# Patient Record
Sex: Female | Born: 1970
Health system: Southern US, Community
[De-identification: ages and names within clinical notes are randomized; demographics above are authoritative.]

---

## 1996-02-26 HISTORY — PX: KNEE ARTHROSCOPY: SUR90

## 1998-05-09 ENCOUNTER — Other Ambulatory Visit: Admission: RE | Admit: 1998-05-09 | Discharge: 1998-05-09 | Payer: Self-pay | Admitting: Gynecology

## 1999-05-15 ENCOUNTER — Other Ambulatory Visit: Admission: RE | Admit: 1999-05-15 | Discharge: 1999-05-15 | Payer: Self-pay | Admitting: Gynecology

## 2000-05-27 ENCOUNTER — Other Ambulatory Visit: Admission: RE | Admit: 2000-05-27 | Discharge: 2000-05-27 | Payer: Self-pay | Admitting: Gynecology

## 2001-06-02 ENCOUNTER — Other Ambulatory Visit: Admission: RE | Admit: 2001-06-02 | Discharge: 2001-06-02 | Payer: Self-pay | Admitting: Gynecology

## 2002-06-08 ENCOUNTER — Other Ambulatory Visit: Admission: RE | Admit: 2002-06-08 | Discharge: 2002-06-08 | Payer: Self-pay | Admitting: Gynecology

## 2003-09-23 ENCOUNTER — Other Ambulatory Visit: Admission: RE | Admit: 2003-09-23 | Discharge: 2003-09-23 | Payer: Self-pay | Admitting: Gynecology

## 2004-08-02 ENCOUNTER — Other Ambulatory Visit: Admission: RE | Admit: 2004-08-02 | Discharge: 2004-08-02 | Payer: Self-pay | Admitting: Obstetrics and Gynecology

## 2004-08-03 ENCOUNTER — Other Ambulatory Visit: Admission: RE | Admit: 2004-08-03 | Discharge: 2004-08-03 | Payer: Self-pay | Admitting: Obstetrics and Gynecology

## 2005-02-25 ENCOUNTER — Inpatient Hospital Stay (HOSPITAL_COMMUNITY): Admission: AD | Admit: 2005-02-25 | Discharge: 2005-02-27 | Payer: Self-pay | Admitting: Obstetrics & Gynecology

## 2007-11-18 ENCOUNTER — Ambulatory Visit (HOSPITAL_COMMUNITY): Admission: RE | Admit: 2007-11-18 | Discharge: 2007-11-18 | Payer: Self-pay | Admitting: Obstetrics and Gynecology

## 2008-02-26 HISTORY — PX: ABDOMINAL HYSTERECTOMY: SHX81

## 2008-05-25 ENCOUNTER — Ambulatory Visit (HOSPITAL_COMMUNITY): Admission: RE | Admit: 2008-05-25 | Discharge: 2008-05-25 | Payer: Self-pay | Admitting: Obstetrics and Gynecology

## 2008-08-18 ENCOUNTER — Ambulatory Visit (HOSPITAL_COMMUNITY): Admission: RE | Admit: 2008-08-18 | Discharge: 2008-08-18 | Payer: Self-pay | Admitting: Obstetrics and Gynecology

## 2008-12-16 ENCOUNTER — Encounter (INDEPENDENT_AMBULATORY_CARE_PROVIDER_SITE_OTHER): Payer: Self-pay | Admitting: Obstetrics and Gynecology

## 2008-12-16 ENCOUNTER — Inpatient Hospital Stay (HOSPITAL_COMMUNITY): Admission: RE | Admit: 2008-12-16 | Discharge: 2008-12-20 | Payer: Self-pay | Admitting: Obstetrics and Gynecology

## 2008-12-26 ENCOUNTER — Ambulatory Visit (HOSPITAL_COMMUNITY): Admission: RE | Admit: 2008-12-26 | Discharge: 2008-12-26 | Payer: Self-pay | Admitting: Obstetrics and Gynecology

## 2009-07-20 ENCOUNTER — Encounter: Admission: RE | Admit: 2009-07-20 | Discharge: 2009-08-19 | Payer: Self-pay | Admitting: Obstetrics and Gynecology

## 2010-05-31 LAB — URINALYSIS, ROUTINE W REFLEX MICROSCOPIC
Glucose, UA: NEGATIVE mg/dL
pH: 7 (ref 5.0–8.0)

## 2010-05-31 LAB — CROSSMATCH: ABO/RH(D): O POS

## 2010-05-31 LAB — COMPREHENSIVE METABOLIC PANEL
ALT: 18 U/L (ref 0–35)
ALT: 34 U/L (ref 0–35)
AST: 48 U/L — ABNORMAL HIGH (ref 0–37)
AST: 56 U/L — ABNORMAL HIGH (ref 0–37)
BUN: 12 mg/dL (ref 6–23)
CO2: 23 mEq/L (ref 19–32)
CO2: 24 mEq/L (ref 19–32)
CO2: 26 mEq/L (ref 19–32)
CO2: 28 mEq/L (ref 19–32)
Calcium: 8.2 mg/dL — ABNORMAL LOW (ref 8.4–10.5)
Chloride: 103 mEq/L (ref 96–112)
Chloride: 105 mEq/L (ref 96–112)
Chloride: 106 mEq/L (ref 96–112)
Chloride: 106 mEq/L (ref 96–112)
Creatinine, Ser: 0.59 mg/dL (ref 0.4–1.2)
Creatinine, Ser: 0.94 mg/dL (ref 0.4–1.2)
Creatinine, Ser: 1.23 mg/dL — ABNORMAL HIGH (ref 0.4–1.2)
GFR calc Af Amer: >60 mL/min (ref >60)
GFR calc Af Amer: >60 mL/min (ref >60)
GFR calc non Af Amer: 49 mL/min — ABNORMAL LOW (ref >60)
GFR calc non Af Amer: >60 mL/min (ref >60)
GFR calc non Af Amer: >60 mL/min (ref >60)
GFR calc non Af Amer: >60 mL/min (ref >60)
Glucose, Bld: 100 mg/dL — ABNORMAL HIGH (ref 70–99)
Glucose, Bld: 84 mg/dL (ref 70–99)
Glucose, Bld: 91 mg/dL (ref 70–99)
Potassium: 3.5 mEq/L (ref 3.5–5.1)
Sodium: 138 mEq/L (ref 135–145)
Total Bilirubin: 0.5 mg/dL (ref 0.3–1.2)
Total Bilirubin: 0.5 mg/dL (ref 0.3–1.2)
Total Bilirubin: 0.6 mg/dL (ref 0.3–1.2)
Total Bilirubin: 0.7 mg/dL (ref 0.3–1.2)

## 2010-05-31 LAB — CBC
HCT: 28.9 % — ABNORMAL LOW (ref 36.0–46.0)
HCT: 37.1 % (ref 36.0–46.0)
Hemoglobin: 12.7 g/dL (ref 12.0–15.0)
Hemoglobin: 8.8 g/dL — ABNORMAL LOW (ref 12.0–15.0)
Hemoglobin: 9 g/dL — ABNORMAL LOW (ref 12.0–15.0)
Hemoglobin: 9.8 g/dL — ABNORMAL LOW (ref 12.0–15.0)
MCHC: 34.2 g/dL (ref 30.0–36.0)
MCHC: 34.2 g/dL (ref 30.0–36.0)
MCV: 100.4 fL — ABNORMAL HIGH (ref 78.0–100.0)
MCV: 94.4 fL (ref 78.0–100.0)
MCV: 94.9 fL (ref 78.0–100.0)
MCV: 96 fL (ref 78.0–100.0)
MCV: 97.1 fL (ref 78.0–100.0)
Platelets: 78 10*3/uL — ABNORMAL LOW (ref 150–400)
Platelets: 82 10*3/uL — ABNORMAL LOW (ref 150–400)
RBC: 2.37 MIL/uL — ABNORMAL LOW (ref 3.87–5.11)
RBC: 2.68 MIL/uL — ABNORMAL LOW (ref 3.87–5.11)
RBC: 2.75 MIL/uL — ABNORMAL LOW (ref 3.87–5.11)
RBC: 3.06 MIL/uL — ABNORMAL LOW (ref 3.87–5.11)
RBC: 3.63 MIL/uL — ABNORMAL LOW (ref 3.87–5.11)
RBC: 3.69 MIL/uL — ABNORMAL LOW (ref 3.87–5.11)
RDW: 13.2 % (ref 11.5–15.5)
RDW: 14.7 % (ref 11.5–15.5)
WBC: 11.3 10*3/uL — ABNORMAL HIGH (ref 4.0–10.5)
WBC: 14.5 10*3/uL — ABNORMAL HIGH (ref 4.0–10.5)
WBC: 15.1 10*3/uL — ABNORMAL HIGH (ref 4.0–10.5)
WBC: 8.8 10*3/uL (ref 4.0–10.5)

## 2010-05-31 LAB — URINE MICROSCOPIC-ADD ON

## 2010-05-31 LAB — PREPARE FRESH FROZEN PLASMA

## 2010-05-31 LAB — PLATELET COUNT: Platelets: 112 10*3/uL — ABNORMAL LOW (ref 150–400)

## 2010-05-31 LAB — PROTIME-INR
INR: 1.22 (ref 0.00–1.49)
Prothrombin Time: 15.3 seconds — ABNORMAL HIGH (ref 11.6–15.2)

## 2010-07-13 NOTE — Discharge Summary (Signed)
[name], [name]                  ACCOUNT NO.:  [id number]   MEDICAL RECORD NO.:  [id number]          PATIENT TYPE:  INP   LOCATION:  9105                          FACILITY:  [facility]   PHYSICIAN:  Randye Lobo, M.D.   DATE OF BIRTH:  17-Jun-1970   DATE OF ADMISSION:  02/25/2005  DATE OF DISCHARGE:  02/27/2005                                 DISCHARGE SUMMARY   FINAL DIAGNOSES:  1.  Intrauterine pregnancy at term.  2.  Spontaneous rupture of membranes.  3.  Breech presentation.   PROCEDURE:  Primary low flap transverse cesarean section. Surgeon:  Dr.  Miguel Aschoff. Complications:  None.   This 40 year old G1 P0 presents at 39+ weeks gestation with spontaneous  rupture of membranes in the morning. The patient was having mild  contractions and on exam was noted be 2-3 cm. She was also thought to have a  breech presentation. This was confirmed by ultrasound. The patient's  antepartum course otherwise up to this point had been uncomplicated. She did  get pregnant using in in vitro fertilization and had some borderline  thrombocytopenia that was followed throughout her pregnancy. Otherwise,  antepartum course had been uncomplicated. She had a negative group B strep  culture obtained in the office in 35 weeks. She was taken to the operating  room on February 25, 2005, by Dr. Miguel Aschoff where a primary low flap  transverse cesarean section was performed with the delivery of an 8-pound 12-  ounce female infant with Apgars of 9 and 9. Delivery went without  complications. The patient's postoperative course was benign without any  significant fevers. The patient did have a syncopal episode on her  postoperative day but by postoperative day #1 she was feeling much better,  no problems. Her hemoglobin came back as 9.9. She was stable. She was felt  ready for discharge on postoperative day #2. Was sent home on a regular  diet, told to decrease activities, told to continue her prenatal vitamins  and  iron supplement twice daily, was given Percocet one to two every 4 hours  as needed for pain, told she could use over-the-counter ibuprofen up to 600  mg every 6 hours as needed for pain, was to follow up in the office in 2  days for her staple removal.   LABORATORY DATA ON DISCHARGE:  The patient had a hemoglobin of 9.7; white  blood cell count of 14.2; platelets of 146,000.      Olivia Ellis, P.A.-C.      Randye Lobo, M.D.  Electronically Signed    MB/MEDQ  D:  03/14/2005  T:  03/14/2005  Job:  [id number]

## 2010-07-13 NOTE — Op Note (Signed)
Olivia Ellis, Olivia Ellis                  ACCOUNT NO.:  [id number]   MEDICAL RECORD NO.:  [id number]          PATIENT TYPE:  INP   LOCATION:  9105                          FACILITY:  [facility]   PHYSICIAN:  Miguel Aschoff, M.D.       DATE OF BIRTH:  1970/05/13   DATE OF PROCEDURE:  02/25/2005  DATE OF DISCHARGE:                                 OPERATIVE REPORT   PREOPERATIVE DIAGNOSES:  1.  Intrauterine pregnancy at term.  2.  Spontaneous rupture of membranes.  3.  Breech presentation.   POSTOPERATIVE DIAGNOSES:  1.  Intrauterine pregnancy at term.  2.  Spontaneous rupture of membranes.  3.  Breech presentation.  4.  Delivery of a viable female infant, Apgars 9 and 9.   OPERATION/PROCEDURE:  Primary low flap transverse cesarean section.   SURGEON:  Miguel Aschoff, M.D.   ANESTHESIA:  Spinal.   COMPLICATIONS:  None.   JUSTIFICATION:  The patient is a 40 year old white female, gravida 1, para  0, at 39+ weeks gestation, developed spontaneous rupture of membranes on the  morning of February 25, 2005.  She is having mild uterine contractions and on  examination was noted to be 2-3 cm but with a breech presentation confirmed  by ultrasound.  Due to the presentation, she is being taken to the operating  room to undergo primary cesarean section.  The risks and benefits of this  procedure were discussed with the patient.   DESCRIPTION OF PROCEDURE:  The patient was taken to the operating room,  placed in the sitting position and spinal anesthesia was administered  without difficulty.  She was then placed in the supine position, deviated to  the left and prepped and draped in the usual sterile fashion.  A Foley  catheter was inserted.  Once this was done, Pfannenstiel incision was made,  extended down through the subcutaneous tissue with bleeding points being  clamped and coagulated as they were encountered.  The fascia was then  identified and incised transversely and separated from the underlying  rectus  muscles.  Rectus muscles were divided in the midline.  The peritoneum was  then found and entered carefully avoiding underlying structures.  Peritoneal  incision was extended under direct visualization.  At this point, the  bladder flap was created and protected with a bladder blade.  An elliptical  transverse incision was made into the lower uterine segment.  The uterine  cavity was entered.  Clear fluid was obtained and at this point the patient  was delivered of a viable female infant, Apgars 9 at one minute and 9 at five  minutes from a breech left sacrum anterior position.  The baby was in frank  breech position.  Nose and mouth were suctioned.  The baby was handed to the  pediatric team in attendance.  Cord bloods were then obtained for  appropriate studies.  The placenta and remaining products of conception were  removed from the uterus. Then the angles of the uterine incision were  identified, ligated using figure-of-eight sutures of #1 Vicryl.  The uterus  was  closed in layers.  The first layer was a running interlocking suture of  #1 Vicryl followed by an imbricating suture of #1 Vicryl.  Bladder flap was  reapproximated using running continuous 2-0 Vicryl suture.  At this point,  inspection was made for hemostasis.  Hemostasis appeared to be excellent.  The abdomen was irrigated with warm saline.  Lap counts and instrument  counts were taken and found to be correct, and then the abdomen was closed.  Parietal peritoneum was closed using running continuous  0 Vicryl suture.  Rectus muscles were reapproximated using running continuous 0 Vicryl suture.  Fascia was closed using two sutures of 0 Vicryl, each starting at the  fascial angles and meeting in the midline. Subcutaneous tissue and skin were  closed using staples.  The estimated blood loss was approximately 600 mL.  The patient tolerated the procedure well and sent to the recovery room in  satisfactory condition.  The  baby was taken to the nursery in satisfactory  condition.      Miguel Aschoff, M.D.  Electronically Signed     AR/MEDQ  D:  02/25/2005  T:  02/25/2005  Job:  [id number]

## 2011-02-06 ENCOUNTER — Other Ambulatory Visit: Payer: Self-pay | Admitting: Obstetrics and Gynecology

## 2012-03-05 ENCOUNTER — Other Ambulatory Visit: Payer: Self-pay | Admitting: Dermatology

## 2012-03-19 ENCOUNTER — Other Ambulatory Visit: Payer: Self-pay | Admitting: Obstetrics and Gynecology

## 2012-03-19 DIAGNOSIS — Z1231 Encounter for screening mammogram for malignant neoplasm of breast: Secondary | ICD-10-CM

## 2012-04-29 ENCOUNTER — Ambulatory Visit
Admission: RE | Admit: 2012-04-29 | Discharge: 2012-04-29 | Disposition: A | Discharge status: Home / Self Care | Payer: BC Managed Care – PPO | Source: Ambulatory Visit | Attending: Obstetrics and Gynecology | Admitting: Obstetrics and Gynecology

## 2012-04-29 DIAGNOSIS — Z1231 Encounter for screening mammogram for malignant neoplasm of breast: Secondary | ICD-10-CM

## 2012-05-05 ENCOUNTER — Other Ambulatory Visit: Payer: Self-pay | Admitting: Obstetrics and Gynecology

## 2012-05-05 DIAGNOSIS — R928 Other abnormal and inconclusive findings on diagnostic imaging of breast: Secondary | ICD-10-CM

## 2012-05-14 ENCOUNTER — Ambulatory Visit
Admission: RE | Admit: 2012-05-14 | Discharge: 2012-05-14 | Disposition: A | Discharge status: Home / Self Care | Payer: BC Managed Care – PPO | Source: Ambulatory Visit | Attending: Obstetrics and Gynecology | Admitting: Obstetrics and Gynecology

## 2012-05-14 ENCOUNTER — Other Ambulatory Visit: Payer: Self-pay | Admitting: Obstetrics and Gynecology

## 2012-05-14 DIAGNOSIS — R928 Other abnormal and inconclusive findings on diagnostic imaging of breast: Secondary | ICD-10-CM

## 2012-05-27 ENCOUNTER — Other Ambulatory Visit: Payer: BC Managed Care – PPO

## 2012-05-28 ENCOUNTER — Ambulatory Visit
Admission: RE | Admit: 2012-05-28 | Discharge: 2012-05-28 | Disposition: A | Discharge status: Home / Self Care | Payer: BC Managed Care – PPO | Source: Ambulatory Visit | Attending: Obstetrics and Gynecology | Admitting: Obstetrics and Gynecology

## 2012-05-28 ENCOUNTER — Ambulatory Visit: Admission: RE | Admit: 2012-05-28 | Payer: BC Managed Care – PPO | Source: Ambulatory Visit

## 2012-05-28 ENCOUNTER — Other Ambulatory Visit: Payer: Self-pay | Admitting: Obstetrics and Gynecology

## 2012-05-28 DIAGNOSIS — R928 Other abnormal and inconclusive findings on diagnostic imaging of breast: Secondary | ICD-10-CM

## 2012-05-28 DIAGNOSIS — R921 Mammographic calcification found on diagnostic imaging of breast: Secondary | ICD-10-CM

## 2012-06-03 ENCOUNTER — Other Ambulatory Visit: Payer: BC Managed Care – PPO

## 2012-06-04 ENCOUNTER — Inpatient Hospital Stay: Admission: RE | Admit: 2012-06-04 | Payer: BC Managed Care – PPO | Source: Ambulatory Visit

## 2012-06-04 ENCOUNTER — Ambulatory Visit
Admission: RE | Admit: 2012-06-04 | Discharge: 2012-06-04 | Disposition: A | Discharge status: Home / Self Care | Payer: BC Managed Care – PPO | Source: Ambulatory Visit | Attending: Obstetrics and Gynecology | Admitting: Obstetrics and Gynecology

## 2012-06-04 DIAGNOSIS — R921 Mammographic calcification found on diagnostic imaging of breast: Secondary | ICD-10-CM

## 2012-06-04 HISTORY — PX: BREAST BIOPSY: SHX20

## 2012-06-09 ENCOUNTER — Telehealth: Payer: Self-pay | Admitting: *Deleted

## 2012-06-09 NOTE — Telephone Encounter (Signed)
Report is already in EPIC, and I have reviewed it.

## 2012-06-09 NOTE — Telephone Encounter (Signed)
Surgical pathology report done on 06/04/2012. Report in your office. Not scanned in to EPIC? sue

## 2013-03-10 ENCOUNTER — Ambulatory Visit: Payer: Self-pay | Admitting: Obstetrics and Gynecology

## 2013-03-18 ENCOUNTER — Ambulatory Visit (INDEPENDENT_AMBULATORY_CARE_PROVIDER_SITE_OTHER): Payer: BC Managed Care – PPO | Admitting: Obstetrics and Gynecology

## 2013-03-18 ENCOUNTER — Encounter: Payer: Self-pay | Admitting: Obstetrics and Gynecology

## 2013-03-18 VITALS — BP 105/62 | HR 62 | Resp 16 | Ht 67.5 in | Wt 120.0 lb

## 2013-03-18 DIAGNOSIS — Z01419 Encounter for gynecological examination (general) (routine) without abnormal findings: Secondary | ICD-10-CM

## 2013-03-18 NOTE — Progress Notes (Signed)
GYNECOLOGY VISIT  PCP: Ivery Qualeaniel Patterson, MD  Referring provider:   HPI: 43 y.o.   Married  Caucasian  female   G2P2 with No LMP recorded. Patient has had a hysterectomy.   Supracervical hyst.  here for  Annual Exam Had a left breast biopsy in April 2014 which was benign fibrocystic change with calcifications.  Having increased headaches.  Occur anytime and are frontal. Are not weekly.  Tylenol not always helpful if waits too long. Still functional with them.   Taking allergy shots.  Patient feels it is stress related.   Not cyclic.  Walks three days a week.  Some ovulation pain.  Works three days a week.  Having significant pain with bowel movements, prior to and during.  Dietary change has not made a difference so far.   GI - Dr. Ewing SchleinMagod.  Hgb:  PCP Urine:  PCP  GYNECOLOGIC HISTORY: No LMP recorded. Patient has had a hysterectomy., supracervical due to placenta accreta. Sexually active:  yes Partner preference: female Contraception:   Hysterectomy Menopausal hormone therapy: no DES exposure:   no Blood transfusions:   Yes    2010 Sexually transmitted diseases:    GYN Procedures:  Hyst   Mammogram: 05/2012 abnormal, needle Bx left breast (benign)                   Pap:   03/04/12 neg. Negative HR HPV History of abnormal pap smear:  no   OB History   Grav Para Term Preterm Abortions TAB SAB Ect Mult Living   2 2        2        LIFESTYLE: Exercise:  Walking 2-4 days a week             Tobacco: no Alcohol: no Drug use:  no  OTHER HEALTH MAINTENANCE: Tetanus/TDap: less than 10 years Gardisil: no Influenza:  yes Zostavax:  no  Bone density: never Colonoscopy: never  Cholesterol check: yes 2014  normal  Family History  Problem Relation Age of Onset  . Hypertension Mother   . Diabetes Mother   . Hyperlipidemia Mother   . Diabetes Father   . Psoriasis Father     There are no active problems to display for this patient.  History reviewed. No pertinent past  medical history.  Past Surgical History  Procedure Laterality Date  . Abdominal hysterectomy  2010  . Cesarean section  2007 2010    x2  . Knee arthroscopy Right 1998    ALLERGIES: Biaxin and Keflex  Current Outpatient Prescriptions  Medication Sig Dispense Refill  . Fexofenadine HCl (ALLEGRA PO) Take by mouth daily.       No current facility-administered medications for this visit.     ROS:  Pertinent items are noted in HPI.  SOCIAL HISTORY:    PHYSICAL EXAMINATION:    BP 105/62  Pulse 62  Resp 16  Ht 5' 7.5" (1.715 m)  Wt 120 lb (54.432 kg)  BMI 18.51 kg/m2   Wt Readings from Last 3 Encounters:  03/18/13 120 lb (54.432 kg)     Ht Readings from Last 3 Encounters:  03/18/13 5' 7.5" (1.715 m)    General appearance: alert, cooperative and appears stated age Head: Normocephalic, without obvious abnormality, atraumatic Neck: no adenopathy, supple, symmetrical, trachea midline and thyroid not enlarged, symmetric, no tenderness/mass/nodules Lungs: clear to auscultation bilaterally Breasts: Inspection negative, No nipple retraction or dimpling, No nipple discharge or bleeding, No axillary or supraclavicular adenopathy, Normal to  palpation without dominant masses Heart: regular rate and rhythm Abdomen: soft, non-tender; no masses,  no organomegaly Extremities: extremities normal, atraumatic, no cyanosis or edema Skin: Skin color, texture, turgor normal. No rashes or lesions Lymph nodes: Cervical, supraclavicular, and axillary nodes normal. No abnormal inguinal nodes palpated Neurologic: Grossly normal  Pelvic: External genitalia:  no lesions              Urethra:  normal appearing urethra with no masses, tenderness or lesions              Bartholins and Skenes: normal                 Vagina: normal appearing vagina with normal color and discharge, no lesions              Cervix: normal appearance              Pap and high risk HPV testing done: no.            Bimanual  Exam:  Uterus:  uterus is absent.                                      Adnexa: normal adnexa in size, nontender and no masses                                      Rectovaginal: Confirms                                      Anus:  normal sphincter tone, no lesions  ASSESSMENT  Normal gynecologic exam. Status post supracervical hysterectomy for placenta accreta.  Ovulatory pain. Status post left breast biopsy - benign fibrocystic change. Rectal pain.  Adhesive disease/  PLAN  Mammogram in April 2015.  Patient will call.  Pap smear and high risk HPV testing not indicated. Counseled on increasing fiber in diet, stress reduction with exercise and decreased responsibilities if possible. Monitor headaches. Advil for headache or ovulatory pain.  To GI if rectal pain persists or worsens.  Labs with PCP.    Return annually or prn   An After Visit Summary was printed and given to the patient.

## 2013-03-18 NOTE — Patient Instructions (Signed)

## 2013-05-06 ENCOUNTER — Ambulatory Visit
Admission: RE | Admit: 2013-05-06 | Discharge: 2013-05-06 | Disposition: A | Discharge status: Home / Self Care | Payer: BC Managed Care – PPO | Source: Ambulatory Visit | Attending: Obstetrics and Gynecology | Admitting: Obstetrics and Gynecology

## 2013-05-06 DIAGNOSIS — Z01419 Encounter for gynecological examination (general) (routine) without abnormal findings: Secondary | ICD-10-CM

## 2013-12-27 ENCOUNTER — Encounter: Payer: Self-pay | Admitting: Obstetrics and Gynecology

## 2014-03-23 ENCOUNTER — Other Ambulatory Visit: Payer: Self-pay | Admitting: Dermatology

## 2014-03-23 ENCOUNTER — Ambulatory Visit: Payer: BC Managed Care – PPO | Admitting: Obstetrics and Gynecology

## 2014-03-30 ENCOUNTER — Encounter: Payer: Self-pay | Admitting: Obstetrics and Gynecology

## 2014-03-30 ENCOUNTER — Ambulatory Visit (INDEPENDENT_AMBULATORY_CARE_PROVIDER_SITE_OTHER): Payer: BLUE CROSS/BLUE SHIELD | Admitting: Obstetrics and Gynecology

## 2014-03-30 VITALS — BP 100/62 | HR 60 | Resp 16 | Ht 66.5 in | Wt 125.8 lb

## 2014-03-30 DIAGNOSIS — Z01419 Encounter for gynecological examination (general) (routine) without abnormal findings: Secondary | ICD-10-CM

## 2014-03-30 DIAGNOSIS — Z0000 Encounter for general adult medical examination without abnormal findings: Secondary | ICD-10-CM

## 2014-03-30 LAB — POCT URINALYSIS DIPSTICK
Bilirubin, UA: NEGATIVE
Glucose, UA: NEGATIVE
Ketones, UA: NEGATIVE
LEUKOCYTES UA: NEGATIVE
Nitrite, UA: NEGATIVE
PH UA: 5
PROTEIN UA: NEGATIVE
UROBILINOGEN UA: NEGATIVE

## 2014-03-30 NOTE — Progress Notes (Signed)
Patient ID: Olivia Ellis, female   DOB: January 23, 1971, 44 y.o.   MRN: [id number] 44 y.o. G2P2 MarriedCaucasianF here for annual exam.   Has periodic random spotting vaginally.  Is status post supracervical hysterectomy for placenta accreta.  Daughter starting kindergarten this year.   Had work up for family history of psoriatic arthritis which was negative.   History of breast calcifications and benign breast biopsy.  Does 3D mammogram yearly.   Having headaches.  No photophobia or nausea. Woke up with a strong headache last month.  Tylenol helped treat pain.   Working part time.   PCP:  Ivery Quale, MD  No LMP recorded. Patient has had a hysterectomy.(supracervical)      Sexually active: Yes.   female partner The current method of family planning is status post hysterectomy.    Exercising: Yes.    cardio and weights. Smoker:  no  Health Maintenance: Pap:  03-04-12 wnl:neg HR HPV History of abnormal Pap:  no MMG:  05-06-13 extremely dense/nl:[facility] Colonoscopy:  never BMD:   never TDaP:  Up to date with PCP pt. thinks Screening Labs:  Hb today: PCP, Urine today: Trace RBC's   reports that she has never smoked. She does not have any smokeless tobacco history on file. She reports that she does not drink alcohol or use illicit drugs.  History reviewed. No pertinent past medical history.  Past Surgical History  Procedure Laterality Date  . Abdominal hysterectomy  2010  . Cesarean section  2007 2010    x2  . Knee arthroscopy Right 1998    Current Outpatient Prescriptions  Medication Sig Dispense Refill  . cetirizine (ZYRTEC) 10 MG tablet Take 10 mg by mouth daily.    . Pediatric Multivit-Minerals-C (FLINTSTONES GUMMIES COMPLETE PO) Take 1 tablet by mouth daily.     No current facility-administered medications for this visit.    Family History  Problem Relation Age of Onset  . Hypertension Mother   . Diabetes Mother   . Hyperlipidemia Mother   . Diabetes  Father   . Psoriasis Father     ROS:  Pertinent items are noted in HPI.  Otherwise, a comprehensive ROS was negative.  Exam:   BP 100/62 mmHg  Pulse 60  Resp 16  Ht 5' 6.5" (1.689 m)  Wt 125 lb 12.8 oz (57.063 kg)  BMI 20.00 kg/m2      Height: 5' 6.5" (168.9 cm)  Ht Readings from Last 3 Encounters:  03/30/14 5' 6.5" (1.689 m)  03/18/13 5' 7.5" (1.715 m)    General appearance: alert, cooperative and appears stated age Head: Normocephalic, without obvious abnormality, atraumatic Neck: no adenopathy, supple, symmetrical, trachea midline and thyroid normal to inspection and palpation Lungs: clear to auscultation bilaterally Breasts: normal appearance, no masses or tenderness, Inspection negative, No nipple retraction or dimpling, No nipple discharge or bleeding, No axillary or supraclavicular adenopathy Heart: regular rate and rhythm Abdomen: soft, non-tender; bowel sounds normal; no masses,  no organomegaly Extremities: extremities normal, atraumatic, no cyanosis or edema Skin: Skin color, texture, turgor normal. No rashes or lesions Lymph nodes: Cervical, supraclavicular, and axillary nodes normal. No abnormal inguinal nodes palpated Neurologic: Grossly normal   Pelvic: External genitalia:  no lesions              Urethra:  normal appearing urethra with no masses, tenderness or lesions              Bartholins and Skenes: normal  Vagina: normal appearing vagina with normal color and discharge, no lesions              Cervix: no lesions              Pap taken: No. Bimanual Exam:  Uterus:  uterus absent              Adnexa: no mass, fullness, tenderness               Rectovaginal: Confirms               Anus:  normal sphincter tone, no lesions  Chaperone was present for exam.  A:  Well Woman with normal exam History of benign breast calcifications and dense breast tissue.  Status post supracervical hysterectomy.  Ovaries remain.  Microscopic hematuria.   Asymptomatic.  No work up needed.  Headaches.  P:   Mammogram 3D yearly.  pap smear not indicated.  I recommend follow up with PCP if headaches persist.  return annually or prn

## 2014-03-30 NOTE — Patient Instructions (Signed)

## 2014-03-31 ENCOUNTER — Other Ambulatory Visit: Payer: Self-pay

## 2014-03-31 DIAGNOSIS — Z1231 Encounter for screening mammogram for malignant neoplasm of breast: Secondary | ICD-10-CM

## 2014-05-11 ENCOUNTER — Ambulatory Visit: Payer: Self-pay

## 2014-05-18 ENCOUNTER — Ambulatory Visit
Admission: RE | Admit: 2014-05-18 | Discharge: 2014-05-18 | Disposition: A | Discharge status: Home / Self Care | Payer: BLUE CROSS/BLUE SHIELD | Source: Ambulatory Visit

## 2014-05-18 DIAGNOSIS — Z1231 Encounter for screening mammogram for malignant neoplasm of breast: Secondary | ICD-10-CM

## 2014-06-01 ENCOUNTER — Other Ambulatory Visit: Payer: Self-pay | Admitting: Dermatology

## 2015-04-05 ENCOUNTER — Ambulatory Visit: Payer: BLUE CROSS/BLUE SHIELD | Admitting: Obstetrics and Gynecology

## 2015-04-19 ENCOUNTER — Ambulatory Visit: Payer: BLUE CROSS/BLUE SHIELD | Admitting: Obstetrics and Gynecology

## 2015-04-19 ENCOUNTER — Encounter: Payer: Self-pay | Admitting: Obstetrics and Gynecology

## 2015-04-20 ENCOUNTER — Telehealth: Payer: Self-pay | Admitting: Obstetrics and Gynecology

## 2015-04-20 NOTE — Telephone Encounter (Signed)
Thank you for the message.  I see the patient has rescheduled.

## 2015-04-20 NOTE — Telephone Encounter (Signed)
Patient canceled her appointment on 04/19/15. (SAME DAY [facility])  Message to [facility] about missed appointment.

## 2015-05-04 ENCOUNTER — Other Ambulatory Visit: Payer: Self-pay

## 2015-05-04 DIAGNOSIS — Z1231 Encounter for screening mammogram for malignant neoplasm of breast: Secondary | ICD-10-CM

## 2015-06-06 DIAGNOSIS — J3089 Other allergic rhinitis: Secondary | ICD-10-CM | POA: Diagnosis not present

## 2015-06-06 DIAGNOSIS — J301 Allergic rhinitis due to pollen: Secondary | ICD-10-CM | POA: Diagnosis not present

## 2015-06-06 DIAGNOSIS — J3081 Allergic rhinitis due to animal (cat) (dog) hair and dander: Secondary | ICD-10-CM | POA: Diagnosis not present

## 2015-06-14 DIAGNOSIS — J3081 Allergic rhinitis due to animal (cat) (dog) hair and dander: Secondary | ICD-10-CM | POA: Diagnosis not present

## 2015-06-14 DIAGNOSIS — J301 Allergic rhinitis due to pollen: Secondary | ICD-10-CM | POA: Diagnosis not present

## 2015-06-14 DIAGNOSIS — J3089 Other allergic rhinitis: Secondary | ICD-10-CM | POA: Diagnosis not present

## 2015-06-28 DIAGNOSIS — J301 Allergic rhinitis due to pollen: Secondary | ICD-10-CM | POA: Diagnosis not present

## 2015-06-28 DIAGNOSIS — J3089 Other allergic rhinitis: Secondary | ICD-10-CM | POA: Diagnosis not present

## 2015-06-28 DIAGNOSIS — J3081 Allergic rhinitis due to animal (cat) (dog) hair and dander: Secondary | ICD-10-CM | POA: Diagnosis not present

## 2015-06-29 ENCOUNTER — Ambulatory Visit
Admission: RE | Admit: 2015-06-29 | Discharge: 2015-06-29 | Disposition: A | Discharge status: Home / Self Care | Payer: BLUE CROSS/BLUE SHIELD | Source: Ambulatory Visit

## 2015-06-29 DIAGNOSIS — Z0000 Encounter for general adult medical examination without abnormal findings: Secondary | ICD-10-CM | POA: Diagnosis not present

## 2015-06-29 DIAGNOSIS — Z1231 Encounter for screening mammogram for malignant neoplasm of breast: Secondary | ICD-10-CM

## 2015-07-05 ENCOUNTER — Encounter: Payer: Self-pay | Admitting: Obstetrics and Gynecology

## 2015-07-05 ENCOUNTER — Ambulatory Visit (INDEPENDENT_AMBULATORY_CARE_PROVIDER_SITE_OTHER): Payer: BLUE CROSS/BLUE SHIELD | Admitting: Obstetrics and Gynecology

## 2015-07-05 VITALS — BP 104/64 | HR 70 | Resp 18 | Ht 67.0 in | Wt 123.6 lb

## 2015-07-05 DIAGNOSIS — Z01419 Encounter for gynecological examination (general) (routine) without abnormal findings: Secondary | ICD-10-CM

## 2015-07-05 DIAGNOSIS — Z1151 Encounter for screening for human papillomavirus (HPV): Secondary | ICD-10-CM | POA: Diagnosis not present

## 2015-07-05 NOTE — Patient Instructions (Signed)
Health Maintenance, Female Adopting a healthy lifestyle and getting preventive care can go a long way to promote health and wellness. Talk with your health care provider about what schedule of regular examinations is right for you. This is a good chance for you to check in with your provider about disease prevention and staying healthy. In between checkups, there are plenty of things you can do on your own. Experts have done a lot of research about which lifestyle changes and preventive measures are most likely to keep you healthy. Ask your health care provider for more information. WEIGHT AND DIET  Eat a healthy diet  Be sure to include plenty of vegetables, fruits, low-fat dairy products, and lean protein.  Do not eat a lot of foods high in solid fats, added sugars, or salt.  Get regular exercise. This is one of the most important things you can do for your health.  Most adults should exercise for at least 150 minutes each week. The exercise should increase your heart rate and make you sweat (moderate-intensity exercise).  Most adults should also do strengthening exercises at least twice a week. This is in addition to the moderate-intensity exercise.  Maintain a healthy weight  Body mass index (BMI) is a measurement that can be used to identify possible weight problems. It estimates body fat based on height and weight. Your health care provider can help determine your BMI and help you achieve or maintain a healthy weight.  For females 20 years of age and older:   A BMI below 18.5 is considered underweight.  A BMI of 18.5 to 24.9 is normal.  A BMI of 25 to 29.9 is considered overweight.  A BMI of 30 and above is considered obese.  Watch levels of cholesterol and blood lipids  You should start having your blood tested for lipids and cholesterol at 43 years of age, then have this test every 5 years.  You may need to have your cholesterol levels checked more often if:  Your lipid  or cholesterol levels are high.  You are older than 43 years of age.  You are at high risk for heart disease.  CANCER SCREENING   Lung Cancer  Lung cancer screening is recommended for adults 55-80 years old who are at high risk for lung cancer because of a history of smoking.  A yearly low-dose CT scan of the lungs is recommended for people who:  Currently smoke.  Have quit within the past 15 years.  Have at least a 30-pack-year history of smoking. A pack year is smoking an average of one pack of cigarettes a day for 1 year.  Yearly screening should continue until it has been 15 years since you quit.  Yearly screening should stop if you develop a health problem that would prevent you from having lung cancer treatment.  Breast Cancer  Practice breast self-awareness. This means understanding how your breasts normally appear and feel.  It also means doing regular breast self-exams. Let your health care provider know about any changes, no matter how small.  If you are in your 20s or 30s, you should have a clinical breast exam (CBE) by a health care provider every 1-3 years as part of a regular health exam.  If you are 40 or older, have a CBE every year. Also consider having a breast X-ray (mammogram) every year.  If you have a family history of breast cancer, talk to your health care provider about genetic screening.  If you   are at high risk for breast cancer, talk to your health care provider about having an MRI and a mammogram every year.  Breast cancer gene (BRCA) assessment is recommended for women who have family members with BRCA-related cancers. BRCA-related cancers include:  Breast.  Ovarian.  Tubal.  Peritoneal cancers.  Results of the assessment will determine the need for genetic counseling and BRCA1 and BRCA2 testing. Cervical Cancer Your health care provider may recommend that you be screened regularly for cancer of the pelvic organs (ovaries, uterus, and  vagina). This screening involves a pelvic examination, including checking for microscopic changes to the surface of your cervix (Pap test). You may be encouraged to have this screening done every 3 years, beginning at age 21.  For women ages 30-65, health care providers may recommend pelvic exams and Pap testing every 3 years, or they may recommend the Pap and pelvic exam, combined with testing for human papilloma virus (HPV), every 5 years. Some types of HPV increase your risk of cervical cancer. Testing for HPV may also be done on women of any age with unclear Pap test results.  Other health care providers may not recommend any screening for nonpregnant women who are considered low risk for pelvic cancer and who do not have symptoms. Ask your health care provider if a screening pelvic exam is right for you.  If you have had past treatment for cervical cancer or a condition that could lead to cancer, you need Pap tests and screening for cancer for at least 20 years after your treatment. If Pap tests have been discontinued, your risk factors (such as having a new sexual partner) need to be reassessed to determine if screening should resume. Some women have medical problems that increase the chance of getting cervical cancer. In these cases, your health care provider may recommend more frequent screening and Pap tests. Colorectal Cancer  This type of cancer can be detected and often prevented.  Routine colorectal cancer screening usually begins at 43 years of age and continues through 43 years of age.  Your health care provider may recommend screening at an earlier age if you have risk factors for colon cancer.  Your health care provider may also recommend using home test kits to check for hidden blood in the stool.  A small camera at the end of a tube can be used to examine your colon directly (sigmoidoscopy or colonoscopy). This is done to check for the earliest forms of colorectal  cancer.  Routine screening usually begins at age 50.  Direct examination of the colon should be repeated every 5-10 years through 43 years of age. However, you may need to be screened more often if early forms of precancerous polyps or small growths are found. Skin Cancer  Check your skin from head to toe regularly.  Tell your health care provider about any new moles or changes in moles, especially if there is a change in a mole's shape or color.  Also tell your health care provider if you have a mole that is larger than the size of a pencil eraser.  Always use sunscreen. Apply sunscreen liberally and repeatedly throughout the day.  Protect yourself by wearing long sleeves, pants, a wide-brimmed hat, and sunglasses whenever you are outside. HEART DISEASE, DIABETES, AND HIGH BLOOD PRESSURE   High blood pressure causes heart disease and increases the risk of stroke. High blood pressure is more likely to develop in:  People who have blood pressure in the high end   of the normal range (130-139/85-89 mm Hg).  People who are overweight or obese.  People who are African American.  If you are 38-23 years of age, have your blood pressure checked every 3-5 years. If you are 61 years of age or older, have your blood pressure checked every year. You should have your blood pressure measured twice--once when you are at a hospital or clinic, and once when you are not at a hospital or clinic. Record the average of the two measurements. To check your blood pressure when you are not at a hospital or clinic, you can use:  An automated blood pressure machine at a pharmacy.  A home blood pressure monitor.  If you are between 45 years and 39 years old, ask your health care provider if you should take aspirin to prevent strokes.  Have regular diabetes screenings. This involves taking a blood sample to check your fasting blood sugar level.  If you are at a normal weight and have a low risk for diabetes,  have this test once every three years after 43 years of age.  If you are overweight and have a high risk for diabetes, consider being tested at a younger age or more often. PREVENTING INFECTION  Hepatitis B  If you have a higher risk for hepatitis B, you should be screened for this virus. You are considered at high risk for hepatitis B if:  You were born in a country where hepatitis B is common. Ask your health care provider which countries are considered high risk.  Your parents were born in a high-risk country, and you have not been immunized against hepatitis B (hepatitis B vaccine).  You have HIV or AIDS.  You use needles to inject street drugs.  You live with someone who has hepatitis B.  You have had sex with someone who has hepatitis B.  You get hemodialysis treatment.  You take certain medicines for conditions, including cancer, organ transplantation, and autoimmune conditions. Hepatitis C  Blood testing is recommended for:  Everyone born from [birth year] through 1963.  Anyone with known risk factors for hepatitis C. Sexually transmitted infections (STIs)  You should be screened for sexually transmitted infections (STIs) including gonorrhea and chlamydia if:  You are sexually active and are younger than 43 years of age.  You are older than 43 years of age and your health care provider tells you that you are at risk for this type of infection.  Your sexual activity has changed since you were last screened and you are at an increased risk for chlamydia or gonorrhea. Ask your health care provider if you are at risk.  If you do not have HIV, but are at risk, it may be recommended that you take a prescription medicine daily to prevent HIV infection. This is called pre-exposure prophylaxis (PrEP). You are considered at risk if:  You are sexually active and do not regularly use condoms or know the HIV status of your partner(s).  You take drugs by injection.  You are sexually  active with a partner who has HIV. Talk with your health care provider about whether you are at high risk of being infected with HIV. If you choose to begin PrEP, you should first be tested for HIV. You should then be tested every 3 months for as long as you are taking PrEP.  PREGNANCY   If you are premenopausal and you may become pregnant, ask your health care provider about preconception counseling.  If you may  become pregnant, take 400 to 800 micrograms (mcg) of folic acid every day.  If you want to prevent pregnancy, talk to your health care provider about birth control (contraception). OSTEOPOROSIS AND MENOPAUSE   Osteoporosis is a disease in which the bones lose minerals and strength with aging. This can result in serious bone fractures. Your risk for osteoporosis can be identified using a bone density scan.  If you are 61 years of age or older, or if you are at risk for osteoporosis and fractures, ask your health care provider if you should be screened.  Ask your health care provider whether you should take a calcium or vitamin D supplement to lower your risk for osteoporosis.  Menopause may have certain physical symptoms and risks.  Hormone replacement therapy may reduce some of these symptoms and risks. Talk to your health care provider about whether hormone replacement therapy is right for you.  HOME CARE INSTRUCTIONS   Schedule regular health, dental, and eye exams.  Stay current with your immunizations.   Do not use any tobacco products including cigarettes, chewing tobacco, or electronic cigarettes.  If you are pregnant, do not drink alcohol.  If you are breastfeeding, limit how much and how often you drink alcohol.  Limit alcohol intake to no more than 1 drink per day for nonpregnant women. One drink equals 12 ounces of beer, 5 ounces of wine, or 1 ounces of hard liquor.  Do not use street drugs.  Do not share needles.  Ask your health care provider for help if  you need support or information about quitting drugs.  Tell your health care provider if you often feel depressed.  Tell your health care provider if you have ever been abused or do not feel safe at home.   This information is not intended to replace advice given to you by your health care provider. Make sure you discuss any questions you have with your health care provider.   Document Released: 08/27/2010 Document Revised: 03/04/2014 Document Reviewed: 01/13/2013 Elsevier Interactive Patient Education [employer].

## 2015-07-05 NOTE — Progress Notes (Signed)
Patient ID: Olivia MeekerLorie A Ellis, female   DOB: Oct 06, 1970, 45 y.o.   MRN: [id number] 45 y.o. G2P2 Married Caucasian female here for annual exam.    Ran a 5K last weekend.   Having random lower abdominal pain.  Lasts for a minute or two. Occurs with need to have bowel movement or urinate. This occurring since last delivery which was a Cesarean Section with supracervical hysterectomy.  BMs once a day or once every other day.   PCP:   Jarome Matinaniel Paterson, MD  No LMP recorded. Patient has had a hysterectomy.           Sexually active: Yes.   female The current method of family planning is status post hysterectomy--SUPRACERVICAL.    Exercising: Yes.    cardio and weight training. Smoker:  no  Health Maintenance: Pap:  03-04-12 Neg:Neg HR HPV(supracervical Hysterectomy) History of abnormal Pap:  no MMG:  07-02-15 Density D/Neg/[facility] Colonoscopy:  n/a BMD:   n/a  Result  n/a TDaP:  PCP Gardasil:   N/A HIV:  Negative in pregnancy.   Screening Labs:  Hb today: PCP, Urine today: unable to void   reports that she has never smoked. She does not have any smokeless tobacco history on file. She reports that she does not drink alcohol or use illicit drugs.  History reviewed. No pertinent past medical history.  Past Surgical History  Procedure Laterality Date  . Abdominal hysterectomy  2010  . Cesarean section  2007 2010    x2  . Knee arthroscopy Right 1998    Current Outpatient Prescriptions  Medication Sig Dispense Refill  . cetirizine (ZYRTEC) 10 MG tablet Take 10 mg by mouth daily.    . Cholecalciferol (VITAMIN D3 ADULT GUMMIES PO) Take 1 tablet by mouth daily. Takes 1 500mg  gummie daily    . Pediatric Multivit-Minerals-C (FLINTSTONES GUMMIES COMPLETE PO) Take 1 tablet by mouth daily.     No current facility-administered medications for this visit.    Family History  Problem Relation Age of Onset  . Hypertension Mother   . Diabetes Mother   . Hyperlipidemia Mother   .  Diabetes Father   . Psoriasis Father     ROS:  Pertinent items are noted in HPI.  Otherwise, a comprehensive ROS was negative.  Exam:   BP 104/64 mmHg  Pulse 70  Resp 18  Ht 5\' 7"  (1.702 m)  Wt 123 lb 9.6 oz (56.065 kg)  BMI 19.35 kg/m2    General appearance: alert, cooperative and appears stated age Head: Normocephalic, without obvious abnormality, atraumatic Neck: no adenopathy, supple, symmetrical, trachea midline and thyroid normal to inspection and palpation Lungs: clear to auscultation bilaterally Breasts: normal appearance, no masses or tenderness, Inspection negative, No nipple retraction or dimpling, No nipple discharge or bleeding, No axillary or supraclavicular adenopathy Heart: regular rate and rhythm Abdomen: incisions:  Yes.    Pfannenstiel , soft, non-tender; no masses, no organomegaly Extremities: extremities normal, atraumatic, no cyanosis or edema Skin: Skin color, texture, turgor normal. No rashes or lesions Lymph nodes: Cervical, supraclavicular, and axillary nodes normal. No abnormal inguinal nodes palpated Neurologic: Grossly normal  Pelvic: External genitalia:  no lesions              Urethra:  normal appearing urethra with no masses, tenderness or lesions              Bartholins and Skenes: normal  Vagina: normal appearing vagina with normal color and discharge, no lesions              Cervix: no lesions              Pap taken: Yes.   Bimanual Exam:  Uterus:  uterus absent              Adnexa: no mass, fullness, tenderness              Rectal exam: Yes.  .  Confirms.              Anus:  normal sphincter tone, no lesions  Chaperone was present for exam.  Assessment:   Well woman visit with normal exam. Lower abdominal pain.  Constipation?  Plan: Yearly mammogram recommended after age 74.  Recommended self breast exam.  Pap and HR HPV as above. Guidelines for Calcium, Vitamin D, regular exercise program including cardiovascular and  weight bearing exercise. Labs performed.  No..   Prescription medication(s) given.  No..   Will try daily Colace or Pericolace.  Pelvic ultrasound if pain persists. Follow up annually and prn.       After visit summary provided.

## 2015-07-06 DIAGNOSIS — J310 Chronic rhinitis: Secondary | ICD-10-CM | POA: Diagnosis not present

## 2015-07-06 DIAGNOSIS — Z1389 Encounter for screening for other disorder: Secondary | ICD-10-CM | POA: Diagnosis not present

## 2015-07-06 DIAGNOSIS — R1084 Generalized abdominal pain: Secondary | ICD-10-CM | POA: Diagnosis not present

## 2015-07-06 DIAGNOSIS — Z0000 Encounter for general adult medical examination without abnormal findings: Secondary | ICD-10-CM | POA: Diagnosis not present

## 2015-07-06 DIAGNOSIS — Z681 Body mass index (BMI) 19 or less, adult: Secondary | ICD-10-CM | POA: Diagnosis not present

## 2015-07-10 DIAGNOSIS — J301 Allergic rhinitis due to pollen: Secondary | ICD-10-CM | POA: Diagnosis not present

## 2015-07-10 DIAGNOSIS — J3081 Allergic rhinitis due to animal (cat) (dog) hair and dander: Secondary | ICD-10-CM | POA: Diagnosis not present

## 2015-07-10 DIAGNOSIS — J3089 Other allergic rhinitis: Secondary | ICD-10-CM | POA: Diagnosis not present

## 2015-07-10 LAB — IPS PAP TEST WITH HPV

## 2015-07-19 DIAGNOSIS — D2239 Melanocytic nevi of other parts of face: Secondary | ICD-10-CM | POA: Diagnosis not present

## 2015-07-19 DIAGNOSIS — D225 Melanocytic nevi of trunk: Secondary | ICD-10-CM | POA: Diagnosis not present

## 2015-07-19 DIAGNOSIS — D1801 Hemangioma of skin and subcutaneous tissue: Secondary | ICD-10-CM | POA: Diagnosis not present

## 2015-07-19 DIAGNOSIS — D2262 Melanocytic nevi of left upper limb, including shoulder: Secondary | ICD-10-CM | POA: Diagnosis not present

## 2015-07-25 DIAGNOSIS — J301 Allergic rhinitis due to pollen: Secondary | ICD-10-CM | POA: Diagnosis not present

## 2015-07-25 DIAGNOSIS — J3081 Allergic rhinitis due to animal (cat) (dog) hair and dander: Secondary | ICD-10-CM | POA: Diagnosis not present

## 2015-07-25 DIAGNOSIS — J3089 Other allergic rhinitis: Secondary | ICD-10-CM | POA: Diagnosis not present

## 2015-08-01 DIAGNOSIS — J301 Allergic rhinitis due to pollen: Secondary | ICD-10-CM | POA: Diagnosis not present

## 2015-08-01 DIAGNOSIS — J3081 Allergic rhinitis due to animal (cat) (dog) hair and dander: Secondary | ICD-10-CM | POA: Diagnosis not present

## 2015-08-01 DIAGNOSIS — J3089 Other allergic rhinitis: Secondary | ICD-10-CM | POA: Diagnosis not present

## 2015-08-07 DIAGNOSIS — J3089 Other allergic rhinitis: Secondary | ICD-10-CM | POA: Diagnosis not present

## 2015-08-07 DIAGNOSIS — J3081 Allergic rhinitis due to animal (cat) (dog) hair and dander: Secondary | ICD-10-CM | POA: Diagnosis not present

## 2015-08-07 DIAGNOSIS — J301 Allergic rhinitis due to pollen: Secondary | ICD-10-CM | POA: Diagnosis not present

## 2015-08-17 DIAGNOSIS — H04123 Dry eye syndrome of bilateral lacrimal glands: Secondary | ICD-10-CM | POA: Diagnosis not present

## 2015-08-17 DIAGNOSIS — J301 Allergic rhinitis due to pollen: Secondary | ICD-10-CM | POA: Diagnosis not present

## 2015-08-17 DIAGNOSIS — J3081 Allergic rhinitis due to animal (cat) (dog) hair and dander: Secondary | ICD-10-CM | POA: Diagnosis not present

## 2015-08-18 DIAGNOSIS — J3089 Other allergic rhinitis: Secondary | ICD-10-CM | POA: Diagnosis not present

## 2015-08-21 DIAGNOSIS — J3081 Allergic rhinitis due to animal (cat) (dog) hair and dander: Secondary | ICD-10-CM | POA: Diagnosis not present

## 2015-08-21 DIAGNOSIS — J3089 Other allergic rhinitis: Secondary | ICD-10-CM | POA: Diagnosis not present

## 2015-08-21 DIAGNOSIS — J301 Allergic rhinitis due to pollen: Secondary | ICD-10-CM | POA: Diagnosis not present

## 2015-09-05 DIAGNOSIS — J301 Allergic rhinitis due to pollen: Secondary | ICD-10-CM | POA: Diagnosis not present

## 2015-09-05 DIAGNOSIS — J3089 Other allergic rhinitis: Secondary | ICD-10-CM | POA: Diagnosis not present

## 2015-09-05 DIAGNOSIS — J3081 Allergic rhinitis due to animal (cat) (dog) hair and dander: Secondary | ICD-10-CM | POA: Diagnosis not present

## 2015-09-18 DIAGNOSIS — J3089 Other allergic rhinitis: Secondary | ICD-10-CM | POA: Diagnosis not present

## 2015-09-18 DIAGNOSIS — J3081 Allergic rhinitis due to animal (cat) (dog) hair and dander: Secondary | ICD-10-CM | POA: Diagnosis not present

## 2015-09-18 DIAGNOSIS — J301 Allergic rhinitis due to pollen: Secondary | ICD-10-CM | POA: Diagnosis not present

## 2015-10-02 DIAGNOSIS — J3089 Other allergic rhinitis: Secondary | ICD-10-CM | POA: Diagnosis not present

## 2015-10-02 DIAGNOSIS — J3081 Allergic rhinitis due to animal (cat) (dog) hair and dander: Secondary | ICD-10-CM | POA: Diagnosis not present

## 2015-10-02 DIAGNOSIS — J301 Allergic rhinitis due to pollen: Secondary | ICD-10-CM | POA: Diagnosis not present

## 2015-10-16 DIAGNOSIS — J3081 Allergic rhinitis due to animal (cat) (dog) hair and dander: Secondary | ICD-10-CM | POA: Diagnosis not present

## 2015-10-16 DIAGNOSIS — J301 Allergic rhinitis due to pollen: Secondary | ICD-10-CM | POA: Diagnosis not present

## 2015-10-16 DIAGNOSIS — J3089 Other allergic rhinitis: Secondary | ICD-10-CM | POA: Diagnosis not present

## 2015-10-31 DIAGNOSIS — J301 Allergic rhinitis due to pollen: Secondary | ICD-10-CM | POA: Diagnosis not present

## 2015-10-31 DIAGNOSIS — J3081 Allergic rhinitis due to animal (cat) (dog) hair and dander: Secondary | ICD-10-CM | POA: Diagnosis not present

## 2015-10-31 DIAGNOSIS — J3089 Other allergic rhinitis: Secondary | ICD-10-CM | POA: Diagnosis not present

## 2015-11-06 DIAGNOSIS — J301 Allergic rhinitis due to pollen: Secondary | ICD-10-CM | POA: Diagnosis not present

## 2015-11-06 DIAGNOSIS — J3081 Allergic rhinitis due to animal (cat) (dog) hair and dander: Secondary | ICD-10-CM | POA: Diagnosis not present

## 2015-11-06 DIAGNOSIS — J3089 Other allergic rhinitis: Secondary | ICD-10-CM | POA: Diagnosis not present

## 2015-11-13 DIAGNOSIS — J301 Allergic rhinitis due to pollen: Secondary | ICD-10-CM | POA: Diagnosis not present

## 2015-11-13 DIAGNOSIS — J3081 Allergic rhinitis due to animal (cat) (dog) hair and dander: Secondary | ICD-10-CM | POA: Diagnosis not present

## 2015-11-13 DIAGNOSIS — J3089 Other allergic rhinitis: Secondary | ICD-10-CM | POA: Diagnosis not present

## 2015-11-20 DIAGNOSIS — J3081 Allergic rhinitis due to animal (cat) (dog) hair and dander: Secondary | ICD-10-CM | POA: Diagnosis not present

## 2015-11-20 DIAGNOSIS — J301 Allergic rhinitis due to pollen: Secondary | ICD-10-CM | POA: Diagnosis not present

## 2015-11-20 DIAGNOSIS — J3089 Other allergic rhinitis: Secondary | ICD-10-CM | POA: Diagnosis not present

## 2015-12-05 DIAGNOSIS — J3089 Other allergic rhinitis: Secondary | ICD-10-CM | POA: Diagnosis not present

## 2015-12-05 DIAGNOSIS — J301 Allergic rhinitis due to pollen: Secondary | ICD-10-CM | POA: Diagnosis not present

## 2015-12-05 DIAGNOSIS — J3081 Allergic rhinitis due to animal (cat) (dog) hair and dander: Secondary | ICD-10-CM | POA: Diagnosis not present

## 2015-12-11 DIAGNOSIS — J301 Allergic rhinitis due to pollen: Secondary | ICD-10-CM | POA: Diagnosis not present

## 2015-12-11 DIAGNOSIS — J3081 Allergic rhinitis due to animal (cat) (dog) hair and dander: Secondary | ICD-10-CM | POA: Diagnosis not present

## 2015-12-11 DIAGNOSIS — J3089 Other allergic rhinitis: Secondary | ICD-10-CM | POA: Diagnosis not present

## 2015-12-20 DIAGNOSIS — L820 Inflamed seborrheic keratosis: Secondary | ICD-10-CM | POA: Diagnosis not present

## 2015-12-20 DIAGNOSIS — L718 Other rosacea: Secondary | ICD-10-CM | POA: Diagnosis not present

## 2015-12-20 DIAGNOSIS — L570 Actinic keratosis: Secondary | ICD-10-CM | POA: Diagnosis not present

## 2015-12-22 DIAGNOSIS — Z23 Encounter for immunization: Secondary | ICD-10-CM | POA: Diagnosis not present

## 2015-12-25 DIAGNOSIS — J3081 Allergic rhinitis due to animal (cat) (dog) hair and dander: Secondary | ICD-10-CM | POA: Diagnosis not present

## 2015-12-25 DIAGNOSIS — J301 Allergic rhinitis due to pollen: Secondary | ICD-10-CM | POA: Diagnosis not present

## 2015-12-25 DIAGNOSIS — J3089 Other allergic rhinitis: Secondary | ICD-10-CM | POA: Diagnosis not present

## 2016-01-01 DIAGNOSIS — J3081 Allergic rhinitis due to animal (cat) (dog) hair and dander: Secondary | ICD-10-CM | POA: Diagnosis not present

## 2016-01-01 DIAGNOSIS — J301 Allergic rhinitis due to pollen: Secondary | ICD-10-CM | POA: Diagnosis not present

## 2016-01-01 DIAGNOSIS — J3089 Other allergic rhinitis: Secondary | ICD-10-CM | POA: Diagnosis not present

## 2016-01-09 DIAGNOSIS — M7542 Impingement syndrome of left shoulder: Secondary | ICD-10-CM | POA: Diagnosis not present

## 2016-01-10 DIAGNOSIS — J301 Allergic rhinitis due to pollen: Secondary | ICD-10-CM | POA: Diagnosis not present

## 2016-01-10 DIAGNOSIS — J3089 Other allergic rhinitis: Secondary | ICD-10-CM | POA: Diagnosis not present

## 2016-01-10 DIAGNOSIS — J3081 Allergic rhinitis due to animal (cat) (dog) hair and dander: Secondary | ICD-10-CM | POA: Diagnosis not present

## 2016-01-12 DIAGNOSIS — M7542 Impingement syndrome of left shoulder: Secondary | ICD-10-CM | POA: Diagnosis not present

## 2016-01-15 DIAGNOSIS — M7532 Calcific tendinitis of left shoulder: Secondary | ICD-10-CM | POA: Diagnosis not present

## 2016-01-17 DIAGNOSIS — J301 Allergic rhinitis due to pollen: Secondary | ICD-10-CM | POA: Diagnosis not present

## 2016-01-17 DIAGNOSIS — J3089 Other allergic rhinitis: Secondary | ICD-10-CM | POA: Diagnosis not present

## 2016-01-17 DIAGNOSIS — J3081 Allergic rhinitis due to animal (cat) (dog) hair and dander: Secondary | ICD-10-CM | POA: Diagnosis not present

## 2016-01-23 DIAGNOSIS — J3081 Allergic rhinitis due to animal (cat) (dog) hair and dander: Secondary | ICD-10-CM | POA: Diagnosis not present

## 2016-01-23 DIAGNOSIS — M7542 Impingement syndrome of left shoulder: Secondary | ICD-10-CM | POA: Diagnosis not present

## 2016-01-23 DIAGNOSIS — J301 Allergic rhinitis due to pollen: Secondary | ICD-10-CM | POA: Diagnosis not present

## 2016-01-23 DIAGNOSIS — J3089 Other allergic rhinitis: Secondary | ICD-10-CM | POA: Diagnosis not present

## 2016-02-05 DIAGNOSIS — J301 Allergic rhinitis due to pollen: Secondary | ICD-10-CM | POA: Diagnosis not present

## 2016-02-05 DIAGNOSIS — J3081 Allergic rhinitis due to animal (cat) (dog) hair and dander: Secondary | ICD-10-CM | POA: Diagnosis not present

## 2016-02-05 DIAGNOSIS — J3089 Other allergic rhinitis: Secondary | ICD-10-CM | POA: Diagnosis not present

## 2016-02-06 DIAGNOSIS — M7542 Impingement syndrome of left shoulder: Secondary | ICD-10-CM | POA: Diagnosis not present

## 2016-02-22 DIAGNOSIS — J301 Allergic rhinitis due to pollen: Secondary | ICD-10-CM | POA: Diagnosis not present

## 2016-02-22 DIAGNOSIS — J3089 Other allergic rhinitis: Secondary | ICD-10-CM | POA: Diagnosis not present

## 2016-02-22 DIAGNOSIS — J3081 Allergic rhinitis due to animal (cat) (dog) hair and dander: Secondary | ICD-10-CM | POA: Diagnosis not present

## 2016-03-04 DIAGNOSIS — J3089 Other allergic rhinitis: Secondary | ICD-10-CM | POA: Diagnosis not present

## 2016-03-04 DIAGNOSIS — J3081 Allergic rhinitis due to animal (cat) (dog) hair and dander: Secondary | ICD-10-CM | POA: Diagnosis not present

## 2016-03-04 DIAGNOSIS — J301 Allergic rhinitis due to pollen: Secondary | ICD-10-CM | POA: Diagnosis not present

## 2016-03-26 DIAGNOSIS — J3089 Other allergic rhinitis: Secondary | ICD-10-CM | POA: Diagnosis not present

## 2016-03-26 DIAGNOSIS — J301 Allergic rhinitis due to pollen: Secondary | ICD-10-CM | POA: Diagnosis not present

## 2016-03-26 DIAGNOSIS — J3081 Allergic rhinitis due to animal (cat) (dog) hair and dander: Secondary | ICD-10-CM | POA: Diagnosis not present

## 2016-03-27 DIAGNOSIS — J3081 Allergic rhinitis due to animal (cat) (dog) hair and dander: Secondary | ICD-10-CM | POA: Diagnosis not present

## 2016-03-27 DIAGNOSIS — J3089 Other allergic rhinitis: Secondary | ICD-10-CM | POA: Diagnosis not present

## 2016-03-27 DIAGNOSIS — J301 Allergic rhinitis due to pollen: Secondary | ICD-10-CM | POA: Diagnosis not present

## 2016-03-27 DIAGNOSIS — J0101 Acute recurrent maxillary sinusitis: Secondary | ICD-10-CM | POA: Diagnosis not present

## 2016-04-23 DIAGNOSIS — J3081 Allergic rhinitis due to animal (cat) (dog) hair and dander: Secondary | ICD-10-CM | POA: Diagnosis not present

## 2016-04-23 DIAGNOSIS — J3089 Other allergic rhinitis: Secondary | ICD-10-CM | POA: Diagnosis not present

## 2016-04-23 DIAGNOSIS — J301 Allergic rhinitis due to pollen: Secondary | ICD-10-CM | POA: Diagnosis not present

## 2016-05-07 DIAGNOSIS — J3089 Other allergic rhinitis: Secondary | ICD-10-CM | POA: Diagnosis not present

## 2016-05-07 DIAGNOSIS — J301 Allergic rhinitis due to pollen: Secondary | ICD-10-CM | POA: Diagnosis not present

## 2016-05-07 DIAGNOSIS — J3081 Allergic rhinitis due to animal (cat) (dog) hair and dander: Secondary | ICD-10-CM | POA: Diagnosis not present

## 2016-05-21 DIAGNOSIS — J301 Allergic rhinitis due to pollen: Secondary | ICD-10-CM | POA: Diagnosis not present

## 2016-05-21 DIAGNOSIS — J3089 Other allergic rhinitis: Secondary | ICD-10-CM | POA: Diagnosis not present

## 2016-05-21 DIAGNOSIS — J3081 Allergic rhinitis due to animal (cat) (dog) hair and dander: Secondary | ICD-10-CM | POA: Diagnosis not present

## 2016-05-22 DIAGNOSIS — L570 Actinic keratosis: Secondary | ICD-10-CM | POA: Diagnosis not present

## 2016-05-22 DIAGNOSIS — D2271 Melanocytic nevi of right lower limb, including hip: Secondary | ICD-10-CM | POA: Diagnosis not present

## 2016-05-27 DIAGNOSIS — J301 Allergic rhinitis due to pollen: Secondary | ICD-10-CM | POA: Diagnosis not present

## 2016-05-28 DIAGNOSIS — J3089 Other allergic rhinitis: Secondary | ICD-10-CM | POA: Diagnosis not present

## 2016-06-06 ENCOUNTER — Other Ambulatory Visit: Payer: Self-pay | Admitting: Obstetrics and Gynecology

## 2016-06-06 DIAGNOSIS — Z1231 Encounter for screening mammogram for malignant neoplasm of breast: Secondary | ICD-10-CM

## 2016-06-11 DIAGNOSIS — J301 Allergic rhinitis due to pollen: Secondary | ICD-10-CM | POA: Diagnosis not present

## 2016-06-11 DIAGNOSIS — J3089 Other allergic rhinitis: Secondary | ICD-10-CM | POA: Diagnosis not present

## 2016-06-11 DIAGNOSIS — J3081 Allergic rhinitis due to animal (cat) (dog) hair and dander: Secondary | ICD-10-CM | POA: Diagnosis not present

## 2016-06-25 DIAGNOSIS — J3081 Allergic rhinitis due to animal (cat) (dog) hair and dander: Secondary | ICD-10-CM | POA: Diagnosis not present

## 2016-06-25 DIAGNOSIS — J3089 Other allergic rhinitis: Secondary | ICD-10-CM | POA: Diagnosis not present

## 2016-06-25 DIAGNOSIS — J301 Allergic rhinitis due to pollen: Secondary | ICD-10-CM | POA: Diagnosis not present

## 2016-07-09 DIAGNOSIS — J3089 Other allergic rhinitis: Secondary | ICD-10-CM | POA: Diagnosis not present

## 2016-07-09 DIAGNOSIS — J301 Allergic rhinitis due to pollen: Secondary | ICD-10-CM | POA: Diagnosis not present

## 2016-07-09 DIAGNOSIS — J3081 Allergic rhinitis due to animal (cat) (dog) hair and dander: Secondary | ICD-10-CM | POA: Diagnosis not present

## 2016-07-11 ENCOUNTER — Ambulatory Visit: Payer: BLUE CROSS/BLUE SHIELD

## 2016-07-11 DIAGNOSIS — R3129 Other microscopic hematuria: Secondary | ICD-10-CM | POA: Diagnosis not present

## 2016-07-11 DIAGNOSIS — Z0000 Encounter for general adult medical examination without abnormal findings: Secondary | ICD-10-CM | POA: Diagnosis not present

## 2016-07-17 ENCOUNTER — Ambulatory Visit (INDEPENDENT_AMBULATORY_CARE_PROVIDER_SITE_OTHER): Payer: BLUE CROSS/BLUE SHIELD | Admitting: Obstetrics and Gynecology

## 2016-07-17 ENCOUNTER — Encounter: Payer: Self-pay | Admitting: Obstetrics and Gynecology

## 2016-07-17 VITALS — BP 112/70 | HR 64 | Resp 16 | Ht 66.75 in | Wt 127.0 lb

## 2016-07-17 DIAGNOSIS — R1030 Lower abdominal pain, unspecified: Secondary | ICD-10-CM | POA: Diagnosis not present

## 2016-07-17 DIAGNOSIS — Z01419 Encounter for gynecological examination (general) (routine) without abnormal findings: Secondary | ICD-10-CM | POA: Diagnosis not present

## 2016-07-17 LAB — POCT URINALYSIS DIPSTICK
BILIRUBIN UA: NEGATIVE
Glucose, UA: NEGATIVE
KETONES UA: NEGATIVE
LEUKOCYTES UA: NEGATIVE
Nitrite, UA: NEGATIVE
Protein, UA: NEGATIVE
Urobilinogen, UA: 0.2 E.U./dL

## 2016-07-17 NOTE — Progress Notes (Signed)
46 y.o. G2P2 Married Caucasian female here for annual exam.    Feeling bloated recently.  No constipation or diarrhea.  Some urinary urgency.  Some LLQ pain but states both ovaries are painful. Pain with BM and urination.  Denies blood in the urine or stool. States she has had the pain since her C-Hyst.   No hot flashes.   Will see PCP soon.  She will do labs with them.   Urine dip - trace RBCs.  PCP: Dr. Jarome Matin - [facility]    No LMP recorded. Patient has had a hysterectomy.           Sexually active: Yes.    The current method of family planning is status post hysterectomy.    Exercising: Yes.    cardio, strength, walking Smoker:  no  Health Maintenance: Pap:  07/05/15 Pap and HR HPV negative  03-04-12 Neg:Neg HR HPV(supracervical Hysterectomy)  02/06/11 Pap smear Neg History of abnormal Pap:  no MMG:  06/29/15 BIRADS 1 negative/density D -- scheduled 07/31/16 at [facility] Colonoscopy:  n/a BMD:   n/a  Result  n/a TDaP: unsure will discuss w/ PCP Gardasil:   N/a HIV: negative with pregnancy Hep C: not done Screening Labs:  Hb today: PCP takes care of labs   reports that she has never smoked. She has never used smokeless tobacco. She reports that she does not drink alcohol or use drugs.  History reviewed. No pertinent past medical history.  Past Surgical History:  Procedure Laterality Date  . ABDOMINAL HYSTERECTOMY  2010  . CESAREAN SECTION  2007 2010   x2  . KNEE ARTHROSCOPY Right 1998    Current Outpatient Prescriptions  Medication Sig Dispense Refill  . cetirizine (ZYRTEC) 10 MG tablet Take 10 mg by mouth daily.     No current facility-administered medications for this visit.     Family History  Problem Relation Age of Onset  . Hypertension Mother   . Diabetes Mother   . Hyperlipidemia Mother   . Diabetes Father   . Psoriasis Father     ROS:  Pertinent items are noted in HPI.  Otherwise, a comprehensive ROS was negative.  Exam:    BP 112/70 (BP Location: Right Arm, Patient Position: Sitting, Cuff Size: Normal)   Pulse 64   Resp 16   Ht 5' 6.75" (1.695 m)   Wt 127 lb (57.6 kg)   BMI 20.04 kg/m     General appearance: alert, cooperative and appears stated age Head: Normocephalic, without obvious abnormality, atraumatic Neck: no adenopathy, supple, symmetrical, trachea midline and thyroid normal to inspection and palpation Lungs: clear to auscultation bilaterally Breasts: normal appearance, no masses or tenderness, No nipple retraction or dimpling, No nipple discharge or bleeding, No axillary or supraclavicular adenopathy Heart: regular rate and rhythm Abdomen: Pfannenstiel incision, soft, non-tender; no masses, no organomegaly Extremities: extremities normal, atraumatic, no cyanosis or edema Skin: Skin color, texture, turgor normal. No rashes or lesions Lymph nodes: Cervical, supraclavicular, and axillary nodes normal. No abnormal inguinal nodes palpated Neurologic: Grossly normal  Pelvic: External genitalia:  no lesions              Urethra:  normal appearing urethra with no masses, tenderness or lesions              Bartholins and Skenes: normal                 Vagina: normal appearing vagina with normal color and discharge, no lesions  Cervix: no lesions              Pap taken: No. Bimanual Exam:  Uterus:  Absent.              Adnexa: no mass, fullness, tenderness              Rectal exam: Yes.  .  Confirms.              Anus:  normal sphincter tone, no lesions  Chaperone was present for exam.  Assessment:   Well woman visit with normal exam. Status post C-Hyst with supracervical hysterectomy.  Chronic lower abdominal pain. Microscopic hematuria.   Plan: Mammogram screening discussed. Recommended self breast awareness. Pap and HR HPV as above. Guidelines for Calcium, Vitamin D, regular exercise program including cardiovascular and weight bearing exercise. Discussed potential causes of  lower abdominal pain - endometriosis, scar tissue, bowel origin.  Return for pelvic ultrasound.  Will consider possible course of low dose combined oral contraceptives.  Discussed also possible GI consultation.  Urine micro and culture.   Follow up annually and prn.   After visit summary provided.

## 2016-07-17 NOTE — Patient Instructions (Signed)

## 2016-07-17 NOTE — Addendum Note (Signed)
Addended by: Michell Heinrich'NEAL, SUMMER D on: 07/17/2016 10:08 AM   Modules accepted: Orders

## 2016-07-18 ENCOUNTER — Telehealth: Payer: Self-pay | Admitting: Obstetrics and Gynecology

## 2016-07-18 DIAGNOSIS — Z1389 Encounter for screening for other disorder: Secondary | ICD-10-CM | POA: Diagnosis not present

## 2016-07-18 DIAGNOSIS — M545 Low back pain: Secondary | ICD-10-CM | POA: Diagnosis not present

## 2016-07-18 DIAGNOSIS — E784 Other hyperlipidemia: Secondary | ICD-10-CM | POA: Diagnosis not present

## 2016-07-18 DIAGNOSIS — R51 Headache: Secondary | ICD-10-CM | POA: Diagnosis not present

## 2016-07-18 DIAGNOSIS — Z0000 Encounter for general adult medical examination without abnormal findings: Secondary | ICD-10-CM | POA: Diagnosis not present

## 2016-07-18 LAB — URINALYSIS, MICROSCOPIC ONLY
BACTERIA UA: NONE SEEN [HPF]
Casts: NONE SEEN [LPF]
Crystals: NONE SEEN [HPF]
RBC / HPF: NONE SEEN RBC/HPF (ref <=2)
WBC UA: NONE SEEN WBC/HPF (ref <=5)
Yeast: NONE SEEN [HPF]

## 2016-07-18 LAB — URINE CULTURE: ORGANISM ID, BACTERIA: NO GROWTH

## 2016-07-18 NOTE — Telephone Encounter (Signed)
Patient is returning a call to Suzy. °

## 2016-07-18 NOTE — Telephone Encounter (Signed)
Returned call to patient. Reviewed benefit for an ultrasound. Patient understood and agreeable. Patient ready to schedule. Patient scheduled 08/01/16 with Dr Edward JollySilva. Patient aware of date, arrival time and cancellation policy. No further questions.   Routing to Dr Edward JollySilva

## 2016-07-18 NOTE — Telephone Encounter (Signed)
Thank you for the update.  Encounter closed. 

## 2016-07-23 ENCOUNTER — Telehealth: Payer: Self-pay

## 2016-07-23 NOTE — Telephone Encounter (Signed)
Left message to call Lord Lancour at [phone]. 

## 2016-07-23 NOTE — Telephone Encounter (Signed)
-----   Message from Olivia SallesBrook E Amundson C Silva, MD sent at 07/19/2016  5:04 PM EDT ----- Please inform patient that her final urine micro and culture are negative.  No further evaluation if urine needed.

## 2016-07-23 NOTE — Telephone Encounter (Signed)
Spoke with patient. Advised of results as seen below from Dr.Silva. Patient is agreeable and verbalizes understanding.  Routing to provider for final review. Patient agreeable to disposition. Will close encounter.     

## 2016-07-25 DIAGNOSIS — J301 Allergic rhinitis due to pollen: Secondary | ICD-10-CM | POA: Diagnosis not present

## 2016-07-25 DIAGNOSIS — J3089 Other allergic rhinitis: Secondary | ICD-10-CM | POA: Diagnosis not present

## 2016-07-25 DIAGNOSIS — J3081 Allergic rhinitis due to animal (cat) (dog) hair and dander: Secondary | ICD-10-CM | POA: Diagnosis not present

## 2016-07-31 ENCOUNTER — Ambulatory Visit
Admission: RE | Admit: 2016-07-31 | Discharge: 2016-07-31 | Disposition: A | Discharge status: Home / Self Care | Payer: BLUE CROSS/BLUE SHIELD | Source: Ambulatory Visit | Attending: Obstetrics and Gynecology | Admitting: Obstetrics and Gynecology

## 2016-07-31 DIAGNOSIS — J3081 Allergic rhinitis due to animal (cat) (dog) hair and dander: Secondary | ICD-10-CM | POA: Diagnosis not present

## 2016-07-31 DIAGNOSIS — Z1231 Encounter for screening mammogram for malignant neoplasm of breast: Secondary | ICD-10-CM

## 2016-07-31 DIAGNOSIS — J3089 Other allergic rhinitis: Secondary | ICD-10-CM | POA: Diagnosis not present

## 2016-07-31 DIAGNOSIS — J301 Allergic rhinitis due to pollen: Secondary | ICD-10-CM | POA: Diagnosis not present

## 2016-08-01 ENCOUNTER — Ambulatory Visit (INDEPENDENT_AMBULATORY_CARE_PROVIDER_SITE_OTHER): Payer: BLUE CROSS/BLUE SHIELD

## 2016-08-01 ENCOUNTER — Encounter: Payer: Self-pay | Admitting: Obstetrics and Gynecology

## 2016-08-01 ENCOUNTER — Ambulatory Visit (INDEPENDENT_AMBULATORY_CARE_PROVIDER_SITE_OTHER): Payer: BLUE CROSS/BLUE SHIELD | Admitting: Obstetrics and Gynecology

## 2016-08-01 VITALS — BP 98/60 | HR 68 | Resp 16 | Wt 127.0 lb

## 2016-08-01 DIAGNOSIS — R1032 Left lower quadrant pain: Secondary | ICD-10-CM | POA: Diagnosis not present

## 2016-08-01 DIAGNOSIS — R1030 Lower abdominal pain, unspecified: Secondary | ICD-10-CM

## 2016-08-01 NOTE — Progress Notes (Signed)
GYNECOLOGY  VISIT   HPI: 46 y.o.   Married  Caucasian  female   G2P2 with No LMP recorded. Patient has had a hysterectomy.   here for pelvic ultrasound for LLQ pain since her Cesarean supracervical hysterectomy. Has dyspareunia also.   Pain in LLQ occurs randomly and lasts for a couple of minutes when it occurs. Can occur "just walking into [employer]." Rarely lasts longer.  Sees Dr. Ewing SchleinMagod for her GI care. Reflux.  Had an upper GI and CT scan years ago.  Sometimes Bms are not really regular, but no concern or problems with this.   Does position changes and uses lubricants for sex.  GYNECOLOGIC HISTORY: No LMP recorded. Patient has had a hysterectomy. Contraception: Hysterectomy Menopausal hormone therapy:  n/a Last mammogram:  07/31/16 BIRADS 1 negative/density d Last pap smear:   07/05/15 Pap and HR HPV negative        OB History    Gravida Para Term Preterm AB Living   2 2       2    SAB TAB Ectopic Multiple Live Births                     There are no active problems to display for this patient.   History reviewed. No pertinent past medical history.  Past Surgical History:  Procedure Laterality Date  . ABDOMINAL HYSTERECTOMY  2010  . BREAST BIOPSY Left 06/04/2012  . CESAREAN SECTION  2007 2010   x2  . KNEE ARTHROSCOPY Right 1998    Current Outpatient Prescriptions  Medication Sig Dispense Refill  . cetirizine (ZYRTEC) 10 MG tablet Take 10 mg by mouth daily.     No current facility-administered medications for this visit.      ALLERGIES: Biaxin [clarithromycin] and Keflex [cephalexin]  Family History  Problem Relation Age of Onset  . Hypertension Mother   . Diabetes Mother   . Hyperlipidemia Mother   . Diabetes Father   . Psoriasis Father     Social History   Social History  . Marital status: Married    Spouse name: N/A  . Number of children: N/A  . Years of education: N/A   Occupational History  . Not on file.   Social History Main Topics  .  Smoking status: Never Smoker  . Smokeless tobacco: Never Used  . Alcohol use No  . Drug use: No  . Sexual activity: Yes    Partners: Male    Birth control/ protection: Surgical     Comment: Hyst--supracervical   Other Topics Concern  . Not on file   Social History Narrative  . No narrative on file    ROS:  Pertinent items are noted in HPI.  PHYSICAL EXAMINATION:    BP 98/60 (BP Location: Right Arm, Patient Position: Sitting, Cuff Size: Normal)   Pulse 68   Resp 16   Wt 127 lb (57.6 kg)   BMI 20.04 kg/m     General appearance: alert, cooperative and appears stated age  Pelvic ultrasound: Absent uterus.  Normal cervix.  Ovaries normal.  Bilateral follicles.  No adnexal masses. No free fluid.    ASSESSMENT  LLQ pain.  Dyspareunia.  I suspect she may have adhesive disease. Status post Cesarean supracervical hysterectomy.    PLAN  Discussed possible tx for pain - Flexeril or Levsin, but I don't think that this will act fast enough.  We reviewed laparoscopy for evaluation and treatment of pain.  We discussed signs and symptoms  of small bowel obstruction as the ultimate sign she needs intervention.  For now, she just wants to observe and she feels better knowing that her reproductive anatomy is normal. Follow up prn.   An After Visit Summary was printed and given to the patient.  ___15___ minutes face to face time of which over 50% was spent in counseling.

## 2016-08-01 NOTE — Progress Notes (Signed)
Encounter reviewed by Dr. Brook Amundson C. Silva.  

## 2016-08-01 NOTE — Patient Instructions (Signed)
Call if your pain worsens!

## 2016-08-06 DIAGNOSIS — J3081 Allergic rhinitis due to animal (cat) (dog) hair and dander: Secondary | ICD-10-CM | POA: Diagnosis not present

## 2016-08-06 DIAGNOSIS — J301 Allergic rhinitis due to pollen: Secondary | ICD-10-CM | POA: Diagnosis not present

## 2016-08-06 DIAGNOSIS — J3089 Other allergic rhinitis: Secondary | ICD-10-CM | POA: Diagnosis not present

## 2016-08-20 DIAGNOSIS — J3081 Allergic rhinitis due to animal (cat) (dog) hair and dander: Secondary | ICD-10-CM | POA: Diagnosis not present

## 2016-08-20 DIAGNOSIS — J3089 Other allergic rhinitis: Secondary | ICD-10-CM | POA: Diagnosis not present

## 2016-08-20 DIAGNOSIS — J301 Allergic rhinitis due to pollen: Secondary | ICD-10-CM | POA: Diagnosis not present

## 2016-09-10 DIAGNOSIS — J3081 Allergic rhinitis due to animal (cat) (dog) hair and dander: Secondary | ICD-10-CM | POA: Diagnosis not present

## 2016-09-10 DIAGNOSIS — J3089 Other allergic rhinitis: Secondary | ICD-10-CM | POA: Diagnosis not present

## 2016-09-10 DIAGNOSIS — J301 Allergic rhinitis due to pollen: Secondary | ICD-10-CM | POA: Diagnosis not present

## 2016-09-24 DIAGNOSIS — J3089 Other allergic rhinitis: Secondary | ICD-10-CM | POA: Diagnosis not present

## 2016-09-24 DIAGNOSIS — J3081 Allergic rhinitis due to animal (cat) (dog) hair and dander: Secondary | ICD-10-CM | POA: Diagnosis not present

## 2016-09-24 DIAGNOSIS — J301 Allergic rhinitis due to pollen: Secondary | ICD-10-CM | POA: Diagnosis not present

## 2016-09-26 DIAGNOSIS — H0011 Chalazion right upper eyelid: Secondary | ICD-10-CM | POA: Diagnosis not present

## 2016-10-07 DIAGNOSIS — J301 Allergic rhinitis due to pollen: Secondary | ICD-10-CM | POA: Diagnosis not present

## 2016-10-07 DIAGNOSIS — J3089 Other allergic rhinitis: Secondary | ICD-10-CM | POA: Diagnosis not present

## 2016-10-07 DIAGNOSIS — J3081 Allergic rhinitis due to animal (cat) (dog) hair and dander: Secondary | ICD-10-CM | POA: Diagnosis not present

## 2016-10-24 DIAGNOSIS — J3089 Other allergic rhinitis: Secondary | ICD-10-CM | POA: Diagnosis not present

## 2016-10-24 DIAGNOSIS — J301 Allergic rhinitis due to pollen: Secondary | ICD-10-CM | POA: Diagnosis not present

## 2016-10-24 DIAGNOSIS — J3081 Allergic rhinitis due to animal (cat) (dog) hair and dander: Secondary | ICD-10-CM | POA: Diagnosis not present

## 2016-11-11 DIAGNOSIS — J3081 Allergic rhinitis due to animal (cat) (dog) hair and dander: Secondary | ICD-10-CM | POA: Diagnosis not present

## 2016-11-11 DIAGNOSIS — J301 Allergic rhinitis due to pollen: Secondary | ICD-10-CM | POA: Diagnosis not present

## 2016-11-11 DIAGNOSIS — J3089 Other allergic rhinitis: Secondary | ICD-10-CM | POA: Diagnosis not present

## 2016-11-15 DIAGNOSIS — J3081 Allergic rhinitis due to animal (cat) (dog) hair and dander: Secondary | ICD-10-CM | POA: Diagnosis not present

## 2016-11-15 DIAGNOSIS — J301 Allergic rhinitis due to pollen: Secondary | ICD-10-CM | POA: Diagnosis not present

## 2016-11-15 DIAGNOSIS — J3089 Other allergic rhinitis: Secondary | ICD-10-CM | POA: Diagnosis not present

## 2016-11-19 DIAGNOSIS — J301 Allergic rhinitis due to pollen: Secondary | ICD-10-CM | POA: Diagnosis not present

## 2016-11-19 DIAGNOSIS — J3081 Allergic rhinitis due to animal (cat) (dog) hair and dander: Secondary | ICD-10-CM | POA: Diagnosis not present

## 2016-11-19 DIAGNOSIS — J3089 Other allergic rhinitis: Secondary | ICD-10-CM | POA: Diagnosis not present

## 2016-11-26 DIAGNOSIS — J301 Allergic rhinitis due to pollen: Secondary | ICD-10-CM | POA: Diagnosis not present

## 2016-11-26 DIAGNOSIS — J3081 Allergic rhinitis due to animal (cat) (dog) hair and dander: Secondary | ICD-10-CM | POA: Diagnosis not present

## 2016-11-26 DIAGNOSIS — J3089 Other allergic rhinitis: Secondary | ICD-10-CM | POA: Diagnosis not present

## 2016-11-28 DIAGNOSIS — H00011 Hordeolum externum right upper eyelid: Secondary | ICD-10-CM | POA: Diagnosis not present

## 2016-11-28 DIAGNOSIS — J301 Allergic rhinitis due to pollen: Secondary | ICD-10-CM | POA: Diagnosis not present

## 2016-11-28 DIAGNOSIS — J3089 Other allergic rhinitis: Secondary | ICD-10-CM | POA: Diagnosis not present

## 2016-11-28 DIAGNOSIS — J3081 Allergic rhinitis due to animal (cat) (dog) hair and dander: Secondary | ICD-10-CM | POA: Diagnosis not present

## 2016-12-02 DIAGNOSIS — J301 Allergic rhinitis due to pollen: Secondary | ICD-10-CM | POA: Diagnosis not present

## 2016-12-02 DIAGNOSIS — J3081 Allergic rhinitis due to animal (cat) (dog) hair and dander: Secondary | ICD-10-CM | POA: Diagnosis not present

## 2016-12-02 DIAGNOSIS — J3089 Other allergic rhinitis: Secondary | ICD-10-CM | POA: Diagnosis not present

## 2016-12-04 DIAGNOSIS — D2239 Melanocytic nevi of other parts of face: Secondary | ICD-10-CM | POA: Diagnosis not present

## 2016-12-04 DIAGNOSIS — D225 Melanocytic nevi of trunk: Secondary | ICD-10-CM | POA: Diagnosis not present

## 2016-12-04 DIAGNOSIS — D1801 Hemangioma of skin and subcutaneous tissue: Secondary | ICD-10-CM | POA: Diagnosis not present

## 2016-12-04 DIAGNOSIS — L819 Disorder of pigmentation, unspecified: Secondary | ICD-10-CM | POA: Diagnosis not present

## 2016-12-17 DIAGNOSIS — J3089 Other allergic rhinitis: Secondary | ICD-10-CM | POA: Diagnosis not present

## 2016-12-17 DIAGNOSIS — J301 Allergic rhinitis due to pollen: Secondary | ICD-10-CM | POA: Diagnosis not present

## 2016-12-24 DIAGNOSIS — J3089 Other allergic rhinitis: Secondary | ICD-10-CM | POA: Diagnosis not present

## 2016-12-24 DIAGNOSIS — J3081 Allergic rhinitis due to animal (cat) (dog) hair and dander: Secondary | ICD-10-CM | POA: Diagnosis not present

## 2016-12-24 DIAGNOSIS — J301 Allergic rhinitis due to pollen: Secondary | ICD-10-CM | POA: Diagnosis not present

## 2016-12-27 DIAGNOSIS — Z23 Encounter for immunization: Secondary | ICD-10-CM | POA: Diagnosis not present

## 2017-01-06 DIAGNOSIS — J3089 Other allergic rhinitis: Secondary | ICD-10-CM | POA: Diagnosis not present

## 2017-01-06 DIAGNOSIS — J301 Allergic rhinitis due to pollen: Secondary | ICD-10-CM | POA: Diagnosis not present

## 2017-01-06 DIAGNOSIS — J3081 Allergic rhinitis due to animal (cat) (dog) hair and dander: Secondary | ICD-10-CM | POA: Diagnosis not present

## 2017-01-15 DIAGNOSIS — J3089 Other allergic rhinitis: Secondary | ICD-10-CM | POA: Diagnosis not present

## 2017-01-15 DIAGNOSIS — J3081 Allergic rhinitis due to animal (cat) (dog) hair and dander: Secondary | ICD-10-CM | POA: Diagnosis not present

## 2017-01-15 DIAGNOSIS — J301 Allergic rhinitis due to pollen: Secondary | ICD-10-CM | POA: Diagnosis not present

## 2017-01-28 DIAGNOSIS — J301 Allergic rhinitis due to pollen: Secondary | ICD-10-CM | POA: Diagnosis not present

## 2017-01-28 DIAGNOSIS — J3089 Other allergic rhinitis: Secondary | ICD-10-CM | POA: Diagnosis not present

## 2017-01-28 DIAGNOSIS — J3081 Allergic rhinitis due to animal (cat) (dog) hair and dander: Secondary | ICD-10-CM | POA: Diagnosis not present

## 2017-02-11 DIAGNOSIS — J3089 Other allergic rhinitis: Secondary | ICD-10-CM | POA: Diagnosis not present

## 2017-02-11 DIAGNOSIS — J301 Allergic rhinitis due to pollen: Secondary | ICD-10-CM | POA: Diagnosis not present

## 2017-02-11 DIAGNOSIS — J3081 Allergic rhinitis due to animal (cat) (dog) hair and dander: Secondary | ICD-10-CM | POA: Diagnosis not present

## 2017-03-04 DIAGNOSIS — J301 Allergic rhinitis due to pollen: Secondary | ICD-10-CM | POA: Diagnosis not present

## 2017-03-04 DIAGNOSIS — J3081 Allergic rhinitis due to animal (cat) (dog) hair and dander: Secondary | ICD-10-CM | POA: Diagnosis not present

## 2017-03-04 DIAGNOSIS — J3089 Other allergic rhinitis: Secondary | ICD-10-CM | POA: Diagnosis not present

## 2017-03-20 DIAGNOSIS — J301 Allergic rhinitis due to pollen: Secondary | ICD-10-CM | POA: Diagnosis not present

## 2017-03-20 DIAGNOSIS — J3081 Allergic rhinitis due to animal (cat) (dog) hair and dander: Secondary | ICD-10-CM | POA: Diagnosis not present

## 2017-03-20 DIAGNOSIS — J3089 Other allergic rhinitis: Secondary | ICD-10-CM | POA: Diagnosis not present

## 2017-04-08 DIAGNOSIS — J301 Allergic rhinitis due to pollen: Secondary | ICD-10-CM | POA: Diagnosis not present

## 2017-04-08 DIAGNOSIS — J3081 Allergic rhinitis due to animal (cat) (dog) hair and dander: Secondary | ICD-10-CM | POA: Diagnosis not present

## 2017-04-08 DIAGNOSIS — J3089 Other allergic rhinitis: Secondary | ICD-10-CM | POA: Diagnosis not present

## 2017-04-22 DIAGNOSIS — J301 Allergic rhinitis due to pollen: Secondary | ICD-10-CM | POA: Diagnosis not present

## 2017-04-22 DIAGNOSIS — J3089 Other allergic rhinitis: Secondary | ICD-10-CM | POA: Diagnosis not present

## 2017-04-22 DIAGNOSIS — J3081 Allergic rhinitis due to animal (cat) (dog) hair and dander: Secondary | ICD-10-CM | POA: Diagnosis not present

## 2017-05-20 DIAGNOSIS — J3081 Allergic rhinitis due to animal (cat) (dog) hair and dander: Secondary | ICD-10-CM | POA: Diagnosis not present

## 2017-05-20 DIAGNOSIS — J301 Allergic rhinitis due to pollen: Secondary | ICD-10-CM | POA: Diagnosis not present

## 2017-05-20 DIAGNOSIS — J3089 Other allergic rhinitis: Secondary | ICD-10-CM | POA: Diagnosis not present

## 2017-06-11 DIAGNOSIS — L570 Actinic keratosis: Secondary | ICD-10-CM | POA: Diagnosis not present

## 2017-07-02 ENCOUNTER — Other Ambulatory Visit: Payer: Self-pay | Admitting: Obstetrics and Gynecology

## 2017-07-02 DIAGNOSIS — Z1231 Encounter for screening mammogram for malignant neoplasm of breast: Secondary | ICD-10-CM

## 2017-07-23 ENCOUNTER — Ambulatory Visit: Payer: BLUE CROSS/BLUE SHIELD | Admitting: Obstetrics and Gynecology

## 2017-07-24 DIAGNOSIS — Z0000 Encounter for general adult medical examination without abnormal findings: Secondary | ICD-10-CM | POA: Diagnosis not present

## 2017-07-31 DIAGNOSIS — M79641 Pain in right hand: Secondary | ICD-10-CM | POA: Diagnosis not present

## 2017-07-31 DIAGNOSIS — M79642 Pain in left hand: Secondary | ICD-10-CM | POA: Diagnosis not present

## 2017-07-31 DIAGNOSIS — Z1389 Encounter for screening for other disorder: Secondary | ICD-10-CM | POA: Diagnosis not present

## 2017-07-31 DIAGNOSIS — M545 Low back pain: Secondary | ICD-10-CM | POA: Diagnosis not present

## 2017-07-31 DIAGNOSIS — Z0000 Encounter for general adult medical examination without abnormal findings: Secondary | ICD-10-CM | POA: Diagnosis not present

## 2017-07-31 DIAGNOSIS — E7849 Other hyperlipidemia: Secondary | ICD-10-CM | POA: Diagnosis not present

## 2017-08-01 DIAGNOSIS — Z1212 Encounter for screening for malignant neoplasm of rectum: Secondary | ICD-10-CM | POA: Diagnosis not present

## 2017-08-15 ENCOUNTER — Other Ambulatory Visit: Payer: Self-pay

## 2017-08-15 ENCOUNTER — Encounter: Payer: Self-pay | Admitting: Obstetrics and Gynecology

## 2017-08-15 ENCOUNTER — Ambulatory Visit: Payer: BLUE CROSS/BLUE SHIELD | Admitting: Obstetrics and Gynecology

## 2017-08-15 ENCOUNTER — Ambulatory Visit
Admission: RE | Admit: 2017-08-15 | Discharge: 2017-08-15 | Disposition: A | Discharge status: Home / Self Care | Payer: BLUE CROSS/BLUE SHIELD | Source: Ambulatory Visit | Attending: Obstetrics and Gynecology | Admitting: Obstetrics and Gynecology

## 2017-08-15 VITALS — BP 110/62 | HR 68 | Resp 16 | Ht 66.75 in | Wt 126.0 lb

## 2017-08-15 DIAGNOSIS — Z1231 Encounter for screening mammogram for malignant neoplasm of breast: Secondary | ICD-10-CM | POA: Diagnosis not present

## 2017-08-15 DIAGNOSIS — Z01419 Encounter for gynecological examination (general) (routine) without abnormal findings: Secondary | ICD-10-CM | POA: Diagnosis not present

## 2017-08-15 NOTE — Progress Notes (Signed)
47 y.o. G2P2 Married Caucasian female here for annual exam.    No more bleeding or spotting.  Increased heat at night, lasts for a short period of time.   Now not really having her lower abdominal pain any more.   Saw PCP last week and had normal visit and labs. Did an IFOB with PCP.  Had an autoimmune panel done as well.   PCP:   Ivery Quale.   No LMP recorded. Patient has had a hysterectomy.           Sexually active: Yes.    The current method of family planning is post hysterectomy--SUPRACERVICAL.    Exercising: Yes.    HIIT, walking, cycling Smoker:  no  Health Maintenance: Pap:  03-04-12 Neg:Neg HR HPV, 07-05-15 neg HPV HR neg (supracervical Hysterectomy) History of abnormal Pap:  no MMG:  07/31/16 BIRADS 1 negative/density d, today Colonoscopy:  n/a BMD:   n/a  Result  n/a TDaP:  PCP Gardasil:  no HIV: done with pcp Hep C: done with pcp Screening Labs:    reports that she has never smoked. She has never used smokeless tobacco. She reports that she does not drink alcohol or use drugs.  History reviewed. No pertinent past medical history.  Past Surgical History:  Procedure Laterality Date  . ABDOMINAL HYSTERECTOMY  2010  . BREAST BIOPSY Left 06/04/2012  . CESAREAN SECTION  2007 2010   x2  . KNEE ARTHROSCOPY Right 1998    Current Outpatient Medications  Medication Sig Dispense Refill  . cetirizine (ZYRTEC) 10 MG tablet Take 10 mg by mouth daily.    Marland Kitchen UNABLE TO FIND Allergy injections q o wk     No current facility-administered medications for this visit.     Family History  Problem Relation Age of Onset  . Hypertension Mother   . Diabetes Mother   . Hyperlipidemia Mother   . Diabetes Father   . Psoriasis Father     Review of Systems  Constitutional: Negative.   HENT: Negative.   Eyes: Negative.   Respiratory: Negative.   Cardiovascular: Negative.   Gastrointestinal: Negative.   Endocrine: Negative.   Genitourinary: Negative.    Musculoskeletal: Negative.   Skin: Negative.   Allergic/Immunologic: Negative.   Neurological: Negative.   Psychiatric/Behavioral: Negative.     Exam:   BP 110/62   Pulse 68   Resp 16   Ht 5' 6.75" (1.695 m)   Wt 126 lb (57.2 kg)   BMI 19.88 kg/m     General appearance: alert, cooperative and appears stated age Head: Normocephalic, without obvious abnormality, atraumatic Neck: no adenopathy, supple, symmetrical, trachea midline and thyroid normal to inspection and palpation Lungs: clear to auscultation bilaterally Breasts: normal appearance, no masses or tenderness, No nipple retraction or dimpling, No nipple discharge or bleeding, No axillary or supraclavicular adenopathy Heart: regular rate and rhythm Abdomen: soft, non-tender; no masses, no organomegaly Extremities: extremities normal, atraumatic, no cyanosis or edema Skin: Skin color, texture, turgor normal. No rashes or lesions Lymph nodes: Cervical, supraclavicular, and axillary nodes normal. No abnormal inguinal nodes palpated Neurologic: Grossly normal  Pelvic: External genitalia:  no lesions              Urethra:  normal appearing urethra with no masses, tenderness or lesions              Bartholins and Skenes: normal                 Vagina: normal  appearing vagina with normal color and discharge, no lesions              Cervix: no lesions              Pap taken: No. Bimanual Exam:  Uterus:   absent              Adnexa: no mass, fullness, tenderness              Rectal exam: Yes.  .  Confirms.              Anus:  normal sphincter tone, no lesions  Chaperone was present for exam.  Assessment:   Well woman visit with normal exam. Perimenopausal? Status post C-Hyst with supracervical hysterectomy.  Chronic lower abdominal pain. Improved.   Hx microscopic hematuria.   Plan: Mammogram screening. Recommended self breast awareness. Pap and HR HPV as above. Guidelines for Calcium, Vitamin D, regular exercise  program including cardiovascular and weight bearing exercise. Discussed Gardasil vaccine.  She declines.  We talked about important health issues in menopause - vaginal atrophy, increased risk of cardiovascular disease, and osteopenia/osteoporosis.  Follow up annually and prn.    After visit summary provided.

## 2017-08-15 NOTE — Patient Instructions (Signed)

## 2017-09-03 DIAGNOSIS — J301 Allergic rhinitis due to pollen: Secondary | ICD-10-CM | POA: Diagnosis not present

## 2017-09-03 DIAGNOSIS — J3081 Allergic rhinitis due to animal (cat) (dog) hair and dander: Secondary | ICD-10-CM | POA: Diagnosis not present

## 2017-09-04 DIAGNOSIS — J3089 Other allergic rhinitis: Secondary | ICD-10-CM | POA: Diagnosis not present

## 2017-12-09 DIAGNOSIS — L570 Actinic keratosis: Secondary | ICD-10-CM | POA: Diagnosis not present

## 2018-01-16 DIAGNOSIS — J3089 Other allergic rhinitis: Secondary | ICD-10-CM | POA: Diagnosis not present

## 2018-01-16 DIAGNOSIS — J3081 Allergic rhinitis due to animal (cat) (dog) hair and dander: Secondary | ICD-10-CM | POA: Diagnosis not present

## 2018-01-16 DIAGNOSIS — J301 Allergic rhinitis due to pollen: Secondary | ICD-10-CM | POA: Diagnosis not present

## 2018-02-24 DIAGNOSIS — H524 Presbyopia: Secondary | ICD-10-CM | POA: Diagnosis not present

## 2018-02-24 DIAGNOSIS — H04123 Dry eye syndrome of bilateral lacrimal glands: Secondary | ICD-10-CM | POA: Diagnosis not present

## 2018-02-25 DIAGNOSIS — U071 COVID-19: Secondary | ICD-10-CM

## 2018-02-25 HISTORY — DX: COVID-19: U07.1

## 2018-05-15 DIAGNOSIS — J301 Allergic rhinitis due to pollen: Secondary | ICD-10-CM | POA: Diagnosis not present

## 2018-05-15 DIAGNOSIS — J3081 Allergic rhinitis due to animal (cat) (dog) hair and dander: Secondary | ICD-10-CM | POA: Diagnosis not present

## 2018-05-15 DIAGNOSIS — J3089 Other allergic rhinitis: Secondary | ICD-10-CM | POA: Diagnosis not present

## 2018-07-28 DIAGNOSIS — Z0000 Encounter for general adult medical examination without abnormal findings: Secondary | ICD-10-CM | POA: Diagnosis not present

## 2018-07-28 DIAGNOSIS — E7849 Other hyperlipidemia: Secondary | ICD-10-CM | POA: Diagnosis not present

## 2018-08-04 DIAGNOSIS — J310 Chronic rhinitis: Secondary | ICD-10-CM | POA: Diagnosis not present

## 2018-08-04 DIAGNOSIS — Z0000 Encounter for general adult medical examination without abnormal findings: Secondary | ICD-10-CM | POA: Diagnosis not present

## 2018-08-04 DIAGNOSIS — E785 Hyperlipidemia, unspecified: Secondary | ICD-10-CM | POA: Diagnosis not present

## 2018-08-04 DIAGNOSIS — Z1331 Encounter for screening for depression: Secondary | ICD-10-CM | POA: Diagnosis not present

## 2018-08-04 DIAGNOSIS — M545 Low back pain: Secondary | ICD-10-CM | POA: Diagnosis not present

## 2018-08-04 DIAGNOSIS — M79641 Pain in right hand: Secondary | ICD-10-CM | POA: Diagnosis not present

## 2018-09-01 DIAGNOSIS — L570 Actinic keratosis: Secondary | ICD-10-CM | POA: Diagnosis not present

## 2018-09-01 DIAGNOSIS — D2262 Melanocytic nevi of left upper limb, including shoulder: Secondary | ICD-10-CM | POA: Diagnosis not present

## 2018-09-04 ENCOUNTER — Other Ambulatory Visit: Payer: Self-pay | Admitting: Obstetrics and Gynecology

## 2018-09-04 DIAGNOSIS — Z1231 Encounter for screening mammogram for malignant neoplasm of breast: Secondary | ICD-10-CM

## 2018-09-11 ENCOUNTER — Ambulatory Visit: Payer: BLUE CROSS/BLUE SHIELD | Admitting: Obstetrics and Gynecology

## 2018-09-15 ENCOUNTER — Encounter: Payer: Self-pay | Admitting: Obstetrics and Gynecology

## 2018-09-15 ENCOUNTER — Other Ambulatory Visit: Payer: Self-pay

## 2018-09-15 ENCOUNTER — Other Ambulatory Visit (HOSPITAL_COMMUNITY)
Admission: RE | Admit: 2018-09-15 | Discharge: 2018-09-15 | Disposition: A | Discharge status: Home / Self Care | Payer: BC Managed Care – PPO | Source: Ambulatory Visit | Attending: Obstetrics and Gynecology | Admitting: Obstetrics and Gynecology

## 2018-09-15 ENCOUNTER — Ambulatory Visit: Payer: BC Managed Care – PPO | Admitting: Obstetrics and Gynecology

## 2018-09-15 VITALS — BP 104/68 | HR 76 | Temp 97.3°F | Resp 14 | Ht 66.75 in | Wt 131.4 lb

## 2018-09-15 DIAGNOSIS — Z01419 Encounter for gynecological examination (general) (routine) without abnormal findings: Secondary | ICD-10-CM

## 2018-09-15 NOTE — Patient Instructions (Signed)

## 2018-09-15 NOTE — Progress Notes (Signed)
48 y.o. G2P2 Married Caucasian female here for annual exam.    No more hot flashes.  No more spotting vaginally.   Labs with PCP.   PCP:   Jarome Matinaniel Paterson, MD  No LMP recorded. Patient has had a hysterectomy.           Sexually active: Yes.    The current method of family planning is status post hysterectomy -- Supracervical.    Exercising: Yes.    HIIT, weights, and walking Smoker:  no  Health Maintenance: Pap:  07-05-15 neg HPV HR neg (supracervical Hysterectomy) History of abnormal Pap:  no MMG:  08/15/17 BIRADS 1 negative/density c  Scheduled for 10/16/18.  Colonoscopy:  Stool sample negative in 2019 TDaP:  PCP Gardasil:   no HIV and Hep C: negative in the past Screening Labs:  PCP   reports that she has never smoked. She has never used smokeless tobacco. She reports that she does not drink alcohol or use drugs.  History reviewed. No pertinent past medical history.  Past Surgical History:  Procedure Laterality Date  . ABDOMINAL HYSTERECTOMY  2010  . BREAST BIOPSY Left 06/04/2012  . CESAREAN SECTION  2007 2010   x2  . KNEE ARTHROSCOPY Right 1998    Current Outpatient Medications  Medication Sig Dispense Refill  . cetirizine (ZYRTEC) 10 MG tablet Take 10 mg by mouth daily.    . Multiple Vitamins-Minerals (AIRBORNE GUMMIES PO) Take by mouth.     No current facility-administered medications for this visit.     Family History  Problem Relation Age of Onset  . Hypertension Mother   . Diabetes Mother   . Hyperlipidemia Mother   . Diabetes Father   . Psoriasis Father     Review of Systems  Constitutional: Negative.   HENT: Negative.   Eyes: Negative.   Respiratory: Negative.   Cardiovascular: Negative.   Gastrointestinal: Negative.   Endocrine: Negative.   Genitourinary: Negative.   Musculoskeletal: Negative.   Skin: Negative.   Allergic/Immunologic: Negative.   Neurological: Negative.   Hematological: Negative.   Psychiatric/Behavioral: Negative.      Exam:   BP 104/68 (BP Location: Right Arm, Patient Position: Sitting, Cuff Size: Normal)   Pulse 76   Temp (!) 97.3 F (36.3 C) (Temporal)   Resp 14   Ht 5' 6.75" (1.695 m)   Wt 131 lb 6.4 oz (59.6 kg)   BMI 20.73 kg/m     General appearance: alert, cooperative and appears stated age Head: normocephalic, without obvious abnormality, atraumatic Neck: no adenopathy, supple, symmetrical, trachea midline and thyroid normal to inspection and palpation Lungs: clear to auscultation bilaterally Breasts: normal appearance, no masses or tenderness, No nipple retraction or dimpling, No nipple discharge or bleeding, No axillary adenopathy Heart: regular rate and rhythm Abdomen: soft, non-tender; no masses, no organomegaly Extremities: extremities normal, atraumatic, no cyanosis or edema Skin: skin color, texture, turgor normal. No rashes or lesions Lymph nodes: cervical, supraclavicular, and axillary nodes normal. Neurologic: grossly normal  Pelvic: External genitalia:  no lesions              No abnormal inguinal nodes palpated.              Urethra:  normal appearing urethra with no masses, tenderness or lesions              Bartholins and Skenes: normal                 Vagina: normal appearing vagina with  normal color and discharge, no lesions              Cervix: no lesions              Pap taken: Yes.   Bimanual Exam:  Uterus: absent              Adnexa: no mass, fullness, tenderness              Rectal exam: Yes.  .  Confirms.              Anus:  normal sphincter tone, no lesions  Chaperone was present for exam.  Assessment:   Well woman visit with normal exam. Status post C-Hyst with supracervical hysterectomy.  Plan: Mammogram screening discussed. Self breast awareness reviewed. Pap and HR HPV as above. Guidelines for Calcium, Vitamin D, regular exercise program including cardiovascular and weight bearing exercise. IFOB declined. We talked about signs and symptoms of  menopause.  She declines blood work to check her status.  Follow up annually and prn.   After visit summary provided.

## 2018-09-17 LAB — CYTOLOGY - PAP
Diagnosis: NEGATIVE
HPV: NOT DETECTED

## 2018-10-16 ENCOUNTER — Ambulatory Visit
Admission: RE | Admit: 2018-10-16 | Discharge: 2018-10-16 | Disposition: A | Discharge status: Home / Self Care | Payer: BC Managed Care – PPO | Source: Ambulatory Visit | Attending: Obstetrics and Gynecology | Admitting: Obstetrics and Gynecology

## 2018-10-16 ENCOUNTER — Other Ambulatory Visit: Payer: Self-pay

## 2018-10-16 DIAGNOSIS — Z1231 Encounter for screening mammogram for malignant neoplasm of breast: Secondary | ICD-10-CM | POA: Diagnosis not present

## 2019-01-01 DIAGNOSIS — M3500 Sicca syndrome, unspecified: Secondary | ICD-10-CM | POA: Diagnosis not present

## 2019-01-01 DIAGNOSIS — I7300 Raynaud's syndrome without gangrene: Secondary | ICD-10-CM | POA: Diagnosis not present

## 2019-01-01 DIAGNOSIS — M2550 Pain in unspecified joint: Secondary | ICD-10-CM | POA: Diagnosis not present

## 2019-01-01 DIAGNOSIS — M545 Low back pain: Secondary | ICD-10-CM | POA: Diagnosis not present

## 2019-01-26 DIAGNOSIS — J3089 Other allergic rhinitis: Secondary | ICD-10-CM | POA: Diagnosis not present

## 2019-01-26 DIAGNOSIS — J301 Allergic rhinitis due to pollen: Secondary | ICD-10-CM | POA: Diagnosis not present

## 2019-01-26 DIAGNOSIS — J3081 Allergic rhinitis due to animal (cat) (dog) hair and dander: Secondary | ICD-10-CM | POA: Diagnosis not present

## 2019-02-05 DIAGNOSIS — M2550 Pain in unspecified joint: Secondary | ICD-10-CM | POA: Diagnosis not present

## 2019-02-05 DIAGNOSIS — M545 Low back pain: Secondary | ICD-10-CM | POA: Diagnosis not present

## 2019-02-05 DIAGNOSIS — I7300 Raynaud's syndrome without gangrene: Secondary | ICD-10-CM | POA: Diagnosis not present

## 2019-02-05 DIAGNOSIS — M459 Ankylosing spondylitis of unspecified sites in spine: Secondary | ICD-10-CM | POA: Diagnosis not present

## 2019-02-20 DIAGNOSIS — Z20828 Contact with and (suspected) exposure to other viral communicable diseases: Secondary | ICD-10-CM | POA: Diagnosis not present

## 2019-04-27 DIAGNOSIS — L570 Actinic keratosis: Secondary | ICD-10-CM | POA: Diagnosis not present

## 2019-07-30 DIAGNOSIS — Z0000 Encounter for general adult medical examination without abnormal findings: Secondary | ICD-10-CM | POA: Diagnosis not present

## 2019-07-30 DIAGNOSIS — E7849 Other hyperlipidemia: Secondary | ICD-10-CM | POA: Diagnosis not present

## 2019-08-06 DIAGNOSIS — R82998 Other abnormal findings in urine: Secondary | ICD-10-CM | POA: Diagnosis not present

## 2019-08-06 DIAGNOSIS — Z1212 Encounter for screening for malignant neoplasm of rectum: Secondary | ICD-10-CM | POA: Diagnosis not present

## 2019-08-06 DIAGNOSIS — Z0000 Encounter for general adult medical examination without abnormal findings: Secondary | ICD-10-CM | POA: Diagnosis not present

## 2019-08-06 DIAGNOSIS — Z1331 Encounter for screening for depression: Secondary | ICD-10-CM | POA: Diagnosis not present

## 2019-09-21 ENCOUNTER — Ambulatory Visit: Payer: BC Managed Care – PPO | Admitting: Obstetrics and Gynecology

## 2019-10-04 ENCOUNTER — Other Ambulatory Visit: Payer: Self-pay | Admitting: Obstetrics and Gynecology

## 2019-10-04 DIAGNOSIS — Z1231 Encounter for screening mammogram for malignant neoplasm of breast: Secondary | ICD-10-CM

## 2019-10-22 ENCOUNTER — Ambulatory Visit
Admission: RE | Admit: 2019-10-22 | Discharge: 2019-10-22 | Disposition: A | Discharge status: Home / Self Care | Payer: BC Managed Care – PPO | Source: Ambulatory Visit | Attending: Obstetrics and Gynecology | Admitting: Obstetrics and Gynecology

## 2019-10-22 ENCOUNTER — Other Ambulatory Visit: Payer: Self-pay

## 2019-10-22 DIAGNOSIS — Z1231 Encounter for screening mammogram for malignant neoplasm of breast: Secondary | ICD-10-CM

## 2019-11-09 ENCOUNTER — Other Ambulatory Visit: Payer: Self-pay

## 2019-11-09 ENCOUNTER — Ambulatory Visit: Payer: BC Managed Care – PPO | Admitting: Obstetrics and Gynecology

## 2019-11-09 ENCOUNTER — Encounter: Payer: Self-pay | Admitting: Obstetrics and Gynecology

## 2019-11-09 VITALS — BP 118/76 | HR 60 | Resp 14 | Ht 66.5 in | Wt 135.0 lb

## 2019-11-09 DIAGNOSIS — N951 Menopausal and female climacteric states: Secondary | ICD-10-CM

## 2019-11-09 DIAGNOSIS — Z01419 Encounter for gynecological examination (general) (routine) without abnormal findings: Secondary | ICD-10-CM | POA: Diagnosis not present

## 2019-11-09 NOTE — Patient Instructions (Signed)

## 2019-11-09 NOTE — Progress Notes (Signed)
49 y.o. G2P2 Married Caucasian female here for annual exam.    Notices increased heat that comes and goes.  Doing OK.  No vaginal spotting.   Had Covid on Christmas Eve.  Received her Covid vaccine on Good Friday.  Has a new dog.   Routine labs with PCP.   PCP: Jarome Matin, MD    No LMP recorded (lmp unknown). Patient has had a hysterectomy.           Sexually active: Yes.    The current method of family planning is hysterectomy -- Supracervical.    Exercising: Yes.    cardio and weights Smoker:  no  Health Maintenance: Pap:   09/15/18 Neg:Neg HR HPV  07/05/15 Neg:Neg HR HPV History of abnormal Pap:  no MMG:  10/22/19 BIRADS 1 negative/density c Colonoscopy:  Stool sample negative in 2019 BMD:   n/a  Result  n/a TDaP:  unsure Gardasil:   no HIV and Hep C: both negative in the past Screening Labs: PCP   reports that she has never smoked. She has never used smokeless tobacco. She reports that she does not drink alcohol and does not use drugs.  Past Medical History:  Diagnosis Date  . COVID-19 virus infection 2020    Past Surgical History:  Procedure Laterality Date  . ABDOMINAL HYSTERECTOMY  2010  . BREAST BIOPSY Left 06/04/2012  . CESAREAN SECTION  2007 2010   x2  . KNEE ARTHROSCOPY Right 1998    Current Outpatient Medications  Medication Sig Dispense Refill  . fexofenadine (ALLEGRA) 180 MG tablet Take 180 mg by mouth daily.     No current facility-administered medications for this visit.  Tumeric.   Family History  Problem Relation Age of Onset  . Hypertension Mother   . Diabetes Mother   . Hyperlipidemia Mother   . Diabetes Father   . Psoriasis Father     Review of Systems  Constitutional: Negative.   HENT: Negative.   Eyes: Negative.   Respiratory: Negative.   Cardiovascular: Negative.   Gastrointestinal: Negative.   Endocrine: Negative.   Genitourinary: Negative.   Musculoskeletal: Negative.   Skin: Negative.   Allergic/Immunologic:  Negative.   Neurological: Negative.   Hematological: Negative.   Psychiatric/Behavioral: Negative.     Exam:   BP 118/76 (BP Location: Right Arm, Patient Position: Sitting, Cuff Size: Normal)   Pulse 60   Resp 14   Ht 5' 6.5" (1.689 m)   Wt 135 lb (61.2 kg)   LMP  (LMP Unknown)   BMI 21.46 kg/m     General appearance: alert, cooperative and appears stated age Head: normocephalic, without obvious abnormality, atraumatic Neck: no adenopathy, supple, symmetrical, trachea midline and thyroid normal to inspection and palpation Lungs: clear to auscultation bilaterally Breasts: normal appearance, no masses or tenderness, No nipple retraction or dimpling, No nipple discharge or bleeding, No axillary adenopathy Heart: regular rate and rhythm Abdomen: soft, non-tender; no masses, no organomegaly Extremities: extremities normal, atraumatic, no cyanosis or edema Skin: skin color, texture, turgor normal. No rashes or lesions Lymph nodes: cervical, supraclavicular, and axillary nodes normal. Neurologic: grossly normal  Pelvic: External genitalia:  no lesions              No abnormal inguinal nodes palpated.              Urethra:  normal appearing urethra with no masses, tenderness or lesions              Bartholins and  Skenes: normal                 Vagina: normal appearing vagina with normal color and discharge, no lesions              Cervix: no lesions              Pap taken: Yes.   Bimanual Exam:  Uterus:  absent              Adnexa: no mass, fullness, tenderness              Rectal exam: Yes.  .  Confirms.              Anus:  normal sphincter tone, no lesions  Chaperone was present for exam.  Assessment:   Well woman visit with normal exam. Status post C-Hyst with supracervical hysterectomy. Menopausal symptoms.   Plan: Mammogram screening discussed. Self breast awareness reviewed. Pap and HR HPV as above. Guidelines for Calcium, Vitamin D, regular exercise program including  cardiovascular and weight bearing exercise. Check FSH and E2.  We discussed TDap vaccine, which was not given today. Follow up annually and prn.   After visit summary provided.

## 2019-11-10 LAB — FOLLICLE STIMULATING HORMONE: FSH: 133 m[IU]/mL

## 2019-11-10 LAB — ESTRADIOL: Estradiol: <5 pg/mL

## 2019-11-12 ENCOUNTER — Encounter: Payer: Self-pay | Admitting: Obstetrics and Gynecology

## 2019-11-19 DIAGNOSIS — L565 Disseminated superficial actinic porokeratosis (DSAP): Secondary | ICD-10-CM | POA: Diagnosis not present

## 2019-11-19 DIAGNOSIS — L821 Other seborrheic keratosis: Secondary | ICD-10-CM | POA: Diagnosis not present

## 2019-11-19 DIAGNOSIS — D2272 Melanocytic nevi of left lower limb, including hip: Secondary | ICD-10-CM | POA: Diagnosis not present

## 2019-11-19 DIAGNOSIS — D225 Melanocytic nevi of trunk: Secondary | ICD-10-CM | POA: Diagnosis not present

## 2020-02-22 ENCOUNTER — Ambulatory Visit: Payer: BC Managed Care – PPO | Admitting: Obstetrics and Gynecology

## 2020-02-22 ENCOUNTER — Encounter: Payer: Self-pay | Admitting: Obstetrics and Gynecology

## 2020-02-22 ENCOUNTER — Other Ambulatory Visit: Payer: Self-pay

## 2020-02-22 VITALS — BP 102/60 | HR 61 | Ht 66.5 in | Wt 135.0 lb

## 2020-02-22 DIAGNOSIS — N951 Menopausal and female climacteric states: Secondary | ICD-10-CM

## 2020-02-22 NOTE — Progress Notes (Signed)
GYNECOLOGY  VISIT   HPI: 49 y.o.   Married  Caucasian  female   G2P2 with No LMP recorded (lmp unknown). Patient has had a hysterectomy.   here for  Discussion of her FSH and estradiol levels.  FSH 133 and E2 <5.  Had some significant hot flashes, that come and go.  Waking up at night with hot flashes.  Hot flashes started a couple of years ago.   Some joint pain.   Vaginal dryness.  Some fatigue.   Had Covid last year.  No booster yet.   Labs with PCP.   GYNECOLOGIC HISTORY: No LMP recorded (lmp unknown). Patient has had a hysterectomy. Contraception:  Hyst Menopausal hormone therapy:  None Last mammogram:  10/22/19 BIRADS 1 negative/density c Last pap smear:  09/15/18 Neg:Neg HR HPV,  07/05/15 Neg:Neg HR HPV, 02-06-11 Neg        OB History    Gravida  2   Para  2   Term      Preterm      AB      Living  2     SAB      IAB      Ectopic      Multiple      Live Births                 There are no problems to display for this patient.   Past Medical History:  Diagnosis Date  . COVID-19 virus infection 2020    Past Surgical History:  Procedure Laterality Date  . ABDOMINAL HYSTERECTOMY  2010  . BREAST BIOPSY Left 06/04/2012  . CESAREAN SECTION  2007 2010   x2  . KNEE ARTHROSCOPY Right 1998    Current Outpatient Medications  Medication Sig Dispense Refill  . Ascorbic Acid (VITAMIN C) 1000 MG tablet Take 1,000 mg by mouth daily.    . cetirizine (ZYRTEC) 10 MG tablet Take 10 mg by mouth daily.    . Cholecalciferol (VITAMIN D3) 125 MCG (5000 UT) TABS Take 1 tablet by mouth daily.    . Zinc Sulfate (ZINC 15 PO) Take 1 capsule by mouth daily.     No current facility-administered medications for this visit.     ALLERGIES: Biaxin [clarithromycin] and Keflex [cephalexin]  Family History  Problem Relation Age of Onset  . Hypertension Mother   . Diabetes Mother   . Hyperlipidemia Mother   . Diabetes Father   . Psoriasis Father     Social  History   Socioeconomic History  . Marital status: Married    Spouse name: Not on file  . Number of children: Not on file  . Years of education: Not on file  . Highest education level: Not on file  Occupational History  . Not on file  Tobacco Use  . Smoking status: Never Smoker  . Smokeless tobacco: Never Used  Vaping Use  . Vaping Use: Never used  Substance and Sexual Activity  . Alcohol use: No    Alcohol/week: 0.0 standard drinks  . Drug use: No  . Sexual activity: Yes    Partners: Male    Birth control/protection: Surgical    Comment: Hyst--supracervical  Other Topics Concern  . Not on file  Social History Narrative  . Not on file   Social Determinants of Health   Financial Resource Strain: Not on file  Food Insecurity: Not on file  Transportation Needs: Not on file  Physical Activity: Not on file  Stress: Not  on file  Social Connections: Not on file  Intimate Partner Violence: Not on file    Review of Systems  All other systems reviewed and are negative.   PHYSICAL EXAMINATION:    BP 102/60   Pulse 61   Ht 5' 6.5" (1.689 m)   Wt 135 lb (61.2 kg)   LMP  (LMP Unknown)   SpO2 100%   BMI 21.46 kg/m     General appearance: alert, cooperative and appears stated age   ASSESSMENT  Menopausal symptoms.  PLAN  We discussed menopausal changes to reproductive function, skin/hair, cardiovascular system, bone density, sleep quality.  Options for care reviewed:  ERT, SSRI/SNRI, herbal options with Melatonin, gabapentin.  Risks and benefits reviewed.  Local vaginal estrogen therapy and vaginal hydrators also reviewed.  Brochure on menopause to patient.  Questions invited and answered. She will see her PCP if her fatigue persists.   52 min total time was spent for this patient encounter, including preparation, face-to-face counseling with the patient, coordination of care, and documentation of the encounter.

## 2020-03-10 DIAGNOSIS — H04123 Dry eye syndrome of bilateral lacrimal glands: Secondary | ICD-10-CM | POA: Diagnosis not present

## 2020-04-10 DIAGNOSIS — M7532 Calcific tendinitis of left shoulder: Secondary | ICD-10-CM | POA: Diagnosis not present

## 2020-04-14 DIAGNOSIS — S4992XA Unspecified injury of left shoulder and upper arm, initial encounter: Secondary | ICD-10-CM | POA: Diagnosis not present

## 2020-05-02 DIAGNOSIS — L565 Disseminated superficial actinic porokeratosis (DSAP): Secondary | ICD-10-CM | POA: Diagnosis not present

## 2020-05-02 DIAGNOSIS — L570 Actinic keratosis: Secondary | ICD-10-CM | POA: Diagnosis not present

## 2020-08-15 DIAGNOSIS — E785 Hyperlipidemia, unspecified: Secondary | ICD-10-CM | POA: Diagnosis not present

## 2020-08-22 DIAGNOSIS — E785 Hyperlipidemia, unspecified: Secondary | ICD-10-CM | POA: Diagnosis not present

## 2020-08-22 DIAGNOSIS — Z1212 Encounter for screening for malignant neoplasm of rectum: Secondary | ICD-10-CM | POA: Diagnosis not present

## 2020-08-22 DIAGNOSIS — Z0000 Encounter for general adult medical examination without abnormal findings: Secondary | ICD-10-CM | POA: Diagnosis not present

## 2020-08-22 DIAGNOSIS — R82998 Other abnormal findings in urine: Secondary | ICD-10-CM | POA: Diagnosis not present

## 2020-10-10 ENCOUNTER — Other Ambulatory Visit: Payer: Self-pay | Admitting: Obstetrics and Gynecology

## 2020-10-10 DIAGNOSIS — Z1231 Encounter for screening mammogram for malignant neoplasm of breast: Secondary | ICD-10-CM

## 2020-10-31 DIAGNOSIS — J301 Allergic rhinitis due to pollen: Secondary | ICD-10-CM | POA: Diagnosis not present

## 2020-10-31 DIAGNOSIS — J3081 Allergic rhinitis due to animal (cat) (dog) hair and dander: Secondary | ICD-10-CM | POA: Diagnosis not present

## 2020-10-31 DIAGNOSIS — J3089 Other allergic rhinitis: Secondary | ICD-10-CM | POA: Diagnosis not present

## 2020-11-03 ENCOUNTER — Other Ambulatory Visit: Payer: Self-pay

## 2020-11-03 ENCOUNTER — Ambulatory Visit
Admission: RE | Admit: 2020-11-03 | Discharge: 2020-11-03 | Disposition: A | Discharge status: Home / Self Care | Payer: BC Managed Care – PPO | Source: Ambulatory Visit

## 2020-11-03 DIAGNOSIS — Z1231 Encounter for screening mammogram for malignant neoplasm of breast: Secondary | ICD-10-CM

## 2020-11-14 ENCOUNTER — Ambulatory Visit: Payer: BC Managed Care – PPO | Admitting: Obstetrics and Gynecology

## 2020-12-05 ENCOUNTER — Encounter: Payer: Self-pay | Admitting: Obstetrics and Gynecology

## 2020-12-05 ENCOUNTER — Other Ambulatory Visit: Payer: Self-pay

## 2020-12-05 ENCOUNTER — Ambulatory Visit (INDEPENDENT_AMBULATORY_CARE_PROVIDER_SITE_OTHER): Payer: BC Managed Care – PPO | Admitting: Obstetrics and Gynecology

## 2020-12-05 VITALS — BP 112/70 | HR 66 | Resp 14 | Ht 67.0 in | Wt 137.0 lb

## 2020-12-05 DIAGNOSIS — Z01419 Encounter for gynecological examination (general) (routine) without abnormal findings: Secondary | ICD-10-CM | POA: Diagnosis not present

## 2020-12-05 NOTE — Progress Notes (Signed)
50 y.o. G2P2 Married Caucasian female here for annual exam.    Having hot flashes sporadically.   Saw dietician.   Trying some soy.   Fatigue.  Wants thyroid testing, vit D, and vit B12. Normal CBC with PCP.   Dad passed 09/2020 with PE age 73.  PCP:  Jarome Matin, MD   No LMP recorded (lmp unknown). Patient has had a hysterectomy.           Sexually active: Yes.    The current method of family planning is status post SUPRACERVICAL hysterectomy.    Exercising: Yes.     Weights, strength, walking Smoker:  no  Health Maintenance: Pap:   09/15/18 Neg:Neg HR HPV, 07/05/15 Neg:Neg HR HPV History of abnormal Pap:  no MMG:  11-03-20 3D/Neg/Birads1 Colonoscopy:  NEVER--Working on scheduling, Dr. Ewing Schlein.  BMD:   n/a  Result  n/a TDaP: Declines today Gardasil:   no HIV: Neg in the past Hep C:Neg in the past Screening Labs:  PCP.    reports that she has never smoked. She has never used smokeless tobacco. She reports that she does not drink alcohol and does not use drugs.  Past Medical History:  Diagnosis Date   COVID-19 virus infection    2020, Jan 2022    Past Surgical History:  Procedure Laterality Date   ABDOMINAL HYSTERECTOMY  2010   BREAST BIOPSY Left 06/04/2012   CESAREAN SECTION  2007 2010   x2   KNEE ARTHROSCOPY Right 1998    Current Outpatient Medications  Medication Sig Dispense Refill   Ascorbic Acid (VITAMIN C) 1000 MG tablet Take 1,000 mg by mouth daily.     cetirizine (ZYRTEC) 10 MG tablet Take 10 mg by mouth daily.     Cholecalciferol (VITAMIN D3) 125 MCG (5000 UT) TABS Take 1 tablet by mouth daily.     Omega-3 Fatty Acids (FISH OIL) 500 MG CAPS      Turmeric 500 MG CAPS      Zinc Sulfate (ZINC 15 PO) Take 1 capsule by mouth daily.     No current facility-administered medications for this visit.    Family History  Problem Relation Age of Onset   Hypertension Mother    Diabetes Mother    Hyperlipidemia Mother    Diabetes Father    Psoriasis  Father    Pulmonary embolism Father     Review of Systems  All other systems reviewed and are negative.  Exam:   BP 112/70   Pulse 66   Resp 14   Ht 5\' 7"  (1.702 m)   Wt 137 lb (62.1 kg)   LMP  (LMP Unknown)   BMI 21.46 kg/m     General appearance: alert, cooperative and appears stated age Head: normocephalic, without obvious abnormality, atraumatic Neck: no adenopathy, supple, symmetrical, trachea midline and thyroid normal to inspection and palpation Lungs: clear to auscultation bilaterally Breasts: normal appearance, no masses or tenderness, No nipple retraction or dimpling, No nipple discharge or bleeding, No axillary adenopathy Heart: regular rate and rhythm Abdomen: soft, non-tender; no masses, no organomegaly Extremities: extremities normal, atraumatic, no cyanosis or edema Skin: skin color, texture, turgor normal. No rashes or lesions Lymph nodes: cervical, supraclavicular, and axillary nodes normal. Neurologic: grossly normal  Pelvic: External genitalia:  no lesions              No abnormal inguinal nodes palpated.              Urethra:  normal appearing urethra  with no masses, tenderness or lesions              Bartholins and Skenes: normal                 Vagina: normal appearing vagina with normal color and discharge, no lesions              Cervix: no lesions              Pap taken: no Bimanual Exam:  Uterus:  absent              Adnexa: no mass, fullness, tenderness              Rectal exam: yes.  Confirms.              Anus:  normal sphincter tone, no lesions  Chaperone was present for exam:  Marchelle Folks, CMA  Assessment:   Well woman visit with gynecologic exam. Status post C-Hyst with supracervical hysterectomy. Menopausal symptoms.  Fatigue.   Plan: Mammogram screening discussed. Self breast awareness reviewed. Pap and HR HPV as above. Guidelines for Calcium, Vitamin D, regular exercise program including cardiovascular and weight bearing  exercise. Check TSH, vit D, vit B12.  We discussed treating menopausal symptoms with ERT, Paxil or Effexor, gabapentin, soy products.  She will let me know if she would like to consider a prescription medication. Follow up annually and prn.   After visit summary provided.

## 2020-12-05 NOTE — Patient Instructions (Signed)

## 2020-12-06 LAB — VITAMIN D 25 HYDROXY (VIT D DEFICIENCY, FRACTURES): Vit D, 25-Hydroxy: 39 ng/mL (ref 30–100)

## 2020-12-06 LAB — VITAMIN B12: Vitamin B-12: 406 pg/mL (ref 200–1100)

## 2020-12-06 LAB — TSH: TSH: 0.91 mIU/L

## 2021-01-09 DIAGNOSIS — L565 Disseminated superficial actinic porokeratosis (DSAP): Secondary | ICD-10-CM | POA: Diagnosis not present

## 2021-01-09 DIAGNOSIS — L570 Actinic keratosis: Secondary | ICD-10-CM | POA: Diagnosis not present

## 2021-05-23 DIAGNOSIS — M7532 Calcific tendinitis of left shoulder: Secondary | ICD-10-CM | POA: Diagnosis not present

## 2021-05-23 DIAGNOSIS — M25512 Pain in left shoulder: Secondary | ICD-10-CM | POA: Diagnosis not present

## 2021-05-29 DIAGNOSIS — L565 Disseminated superficial actinic porokeratosis (DSAP): Secondary | ICD-10-CM | POA: Diagnosis not present

## 2021-05-29 DIAGNOSIS — D2239 Melanocytic nevi of other parts of face: Secondary | ICD-10-CM | POA: Diagnosis not present

## 2021-05-29 DIAGNOSIS — D1801 Hemangioma of skin and subcutaneous tissue: Secondary | ICD-10-CM | POA: Diagnosis not present

## 2021-05-29 DIAGNOSIS — D225 Melanocytic nevi of trunk: Secondary | ICD-10-CM | POA: Diagnosis not present

## 2021-06-02 DIAGNOSIS — S4992XA Unspecified injury of left shoulder and upper arm, initial encounter: Secondary | ICD-10-CM | POA: Diagnosis not present

## 2021-06-05 DIAGNOSIS — H04123 Dry eye syndrome of bilateral lacrimal glands: Secondary | ICD-10-CM | POA: Diagnosis not present

## 2021-06-05 DIAGNOSIS — H524 Presbyopia: Secondary | ICD-10-CM | POA: Diagnosis not present

## 2021-06-15 DIAGNOSIS — Z1211 Encounter for screening for malignant neoplasm of colon: Secondary | ICD-10-CM | POA: Diagnosis not present

## 2021-07-09 DIAGNOSIS — M25512 Pain in left shoulder: Secondary | ICD-10-CM | POA: Diagnosis not present

## 2021-08-06 DIAGNOSIS — M25512 Pain in left shoulder: Secondary | ICD-10-CM | POA: Diagnosis not present

## 2021-09-25 DIAGNOSIS — E785 Hyperlipidemia, unspecified: Secondary | ICD-10-CM | POA: Diagnosis not present

## 2021-10-02 DIAGNOSIS — Z0000 Encounter for general adult medical examination without abnormal findings: Secondary | ICD-10-CM | POA: Diagnosis not present

## 2021-10-02 DIAGNOSIS — E785 Hyperlipidemia, unspecified: Secondary | ICD-10-CM | POA: Diagnosis not present

## 2021-10-02 DIAGNOSIS — R82998 Other abnormal findings in urine: Secondary | ICD-10-CM | POA: Diagnosis not present

## 2021-10-02 DIAGNOSIS — Z1331 Encounter for screening for depression: Secondary | ICD-10-CM | POA: Diagnosis not present

## 2021-10-16 DIAGNOSIS — M19012 Primary osteoarthritis, left shoulder: Secondary | ICD-10-CM | POA: Diagnosis not present

## 2021-10-16 DIAGNOSIS — M7522 Bicipital tendinitis, left shoulder: Secondary | ICD-10-CM | POA: Diagnosis not present

## 2021-10-16 DIAGNOSIS — M7542 Impingement syndrome of left shoulder: Secondary | ICD-10-CM | POA: Diagnosis not present

## 2021-10-31 ENCOUNTER — Other Ambulatory Visit: Payer: Self-pay | Admitting: Obstetrics and Gynecology

## 2021-10-31 DIAGNOSIS — Z1231 Encounter for screening mammogram for malignant neoplasm of breast: Secondary | ICD-10-CM

## 2021-11-16 ENCOUNTER — Ambulatory Visit
Admission: RE | Admit: 2021-11-16 | Discharge: 2021-11-16 | Disposition: A | Discharge status: Home / Self Care | Payer: BC Managed Care – PPO | Source: Ambulatory Visit

## 2021-11-16 DIAGNOSIS — Z1231 Encounter for screening mammogram for malignant neoplasm of breast: Secondary | ICD-10-CM

## 2021-11-27 DIAGNOSIS — L821 Other seborrheic keratosis: Secondary | ICD-10-CM | POA: Diagnosis not present

## 2021-11-27 DIAGNOSIS — L570 Actinic keratosis: Secondary | ICD-10-CM | POA: Diagnosis not present

## 2021-12-04 NOTE — Progress Notes (Signed)
51 y.o. G2P2 Married Caucasian female here for annual exam.    Taking Estoven.  Still having hot flashes, and are sporadic.  Managing ok.   Some vaginal dryness.  Using vaginal lubricant.  Having left shoulder in December.   PCP:   Leanna Battles MD   No LMP recorded (lmp unknown). Patient has had a hysterectomy.           Sexually active: Yes.    The current method of family planning is SUPRACERVICAL hysterectomy.    Exercising: Yes.    Gym/ health club routine includes cardio and light weights. Smoker:  no  Health Maintenance: Pap:   09/15/18 Neg:Neg HR HPV, 07/05/15 Neg:Neg HR HPV History of abnormal Pap:  no MMG:  11/19/21 density C Bi-rads 1 neg  Colonoscopy: 05/2021 normal f/u 10  BMD:   n/a  Result  n/a TDaP:has declined in the past   Gardasil:   no HIV  neg in pregnancy.  Hep C:  declined.  She may have tested in the past.  Screening Labs: pcp  Flu vaccine:  completed.  Covid vaccine:  discussed.    reports that she has never smoked. She has never used smokeless tobacco. She reports that she does not drink alcohol and does not use drugs.  Past Medical History:  Diagnosis Date   COVID-19 virus infection    2020, Jan 2022    Past Surgical History:  Procedure Laterality Date   ABDOMINAL HYSTERECTOMY  2010   BREAST BIOPSY Left 06/04/2012   CESAREAN SECTION  2007 2010   x2   KNEE ARTHROSCOPY Right 1998    Current Outpatient Medications  Medication Sig Dispense Refill   Ascorbic Acid (VITAMIN C) 1000 MG tablet Take 1,000 mg by mouth daily.     cetirizine (ZYRTEC) 10 MG tablet Take 10 mg by mouth daily.     Cholecalciferol (VITAMIN D3) 125 MCG (5000 UT) TABS Take 1 tablet by mouth daily.     Omega-3 Fatty Acids (FISH OIL) 500 MG CAPS      Turmeric 500 MG CAPS      Zinc Sulfate (ZINC 15 PO) Take 1 capsule by mouth daily.     No current facility-administered medications for this visit.    Family History  Problem Relation Age of Onset   Hypertension Mother     Diabetes Mother    Hyperlipidemia Mother    Diabetes Father    Psoriasis Father    Pulmonary embolism Father     Review of Systems  All other systems reviewed and are negative.   Exam:   BP 122/64   Pulse 66   Ht 5' 6.54" (1.69 m)   Wt 143 lb (64.9 kg)   LMP  (LMP Unknown)   SpO2 100%   BMI 22.71 kg/m     General appearance: alert, cooperative and appears stated age Head: normocephalic, without obvious abnormality, atraumatic Neck: no adenopathy, supple, symmetrical, trachea midline and thyroid normal to inspection and palpation Lungs: clear to auscultation bilaterally Breasts: normal appearance, no masses or tenderness, No nipple retraction or dimpling, No nipple discharge or bleeding, No axillary adenopathy Heart: regular rate and rhythm Abdomen: soft, non-tender; no masses, no organomegaly Extremities: extremities normal, atraumatic, no cyanosis or edema Skin: skin color, texture, turgor normal. No rashes or lesions Lymph nodes: cervical, supraclavicular, and axillary nodes normal. Neurologic: grossly normal  Pelvic: External genitalia:  no lesions              No abnormal  inguinal nodes palpated.              Urethra:  normal appearing urethra with no masses, tenderness or lesions              Bartholins and Skenes: normal                 Vagina: normal appearing vagina with normal color and discharge, no lesions              Cervix: no lesions              Pap taken: no Bimanual Exam:  Uterus:  normal size, contour, position, consistency, mobility, non-tender              Adnexa: no mass, fullness, tenderness              Rectal exam: yes.  Confirms.              Anus:  normal sphincter tone, no lesions  Chaperone was present for exam:  Babs Sciara, CMA  Assessment:   Well woman visit with gynecologic exam. Status post C-Hyst with supracervical hysterectomy. Menopausal symptoms. Managing OK.   Plan: Mammogram screening discussed. Self breast awareness  reviewed. Pap and HR HPV as above. Guidelines for Calcium, Vitamin D, regular exercise program including cardiovascular and weight bearing exercise. Labs with PCP.  Follow up annually and prn.   After visit summary provided.

## 2021-12-11 ENCOUNTER — Encounter: Payer: Self-pay | Admitting: Obstetrics and Gynecology

## 2021-12-11 ENCOUNTER — Ambulatory Visit (INDEPENDENT_AMBULATORY_CARE_PROVIDER_SITE_OTHER): Payer: BC Managed Care – PPO | Admitting: Obstetrics and Gynecology

## 2021-12-11 VITALS — BP 122/64 | HR 66 | Ht 66.54 in | Wt 143.0 lb

## 2021-12-11 DIAGNOSIS — Z01419 Encounter for gynecological examination (general) (routine) without abnormal findings: Secondary | ICD-10-CM | POA: Diagnosis not present

## 2021-12-11 NOTE — Patient Instructions (Signed)
EXERCISE AND DIET:  We recommended that you start or continue a regular exercise program for good health. Regular exercise means any activity that makes your heart beat faster and makes you sweat.  We recommend exercising at least 30 minutes per day at least 3 days a week, preferably 4 or 5.  We also recommend a diet low in fat and sugar.  Inactivity, poor dietary choices and obesity can cause diabetes, heart attack, stroke, and kidney damage, among others.    ALCOHOL AND SMOKING:  Women should limit their alcohol intake to no more than 7 drinks/beers/glasses of wine (combined, not each!) per week. Moderation of alcohol intake to this level decreases your risk of breast cancer and liver damage. And of course, no recreational drugs are part of a healthy lifestyle.  And absolutely no smoking or even second hand smoke. Most people know smoking can cause heart and lung diseases, but did you know it also contributes to weakening of your bones? Aging of your skin?  Yellowing of your teeth and nails?  CALCIUM AND VITAMIN D:  Adequate intake of calcium and Vitamin D are recommended.  The recommendations for exact amounts of these supplements seem to change often, but generally speaking 600 mg of calcium (either carbonate or citrate) and 800 units of Vitamin D per day seems prudent. Certain women may benefit from higher intake of Vitamin D.  If you are among these women, your doctor will have told you during your visit.    PAP SMEARS:  Pap smears, to check for cervical cancer or precancers,  have traditionally been done yearly, although recent scientific advances have shown that most women can have pap smears less often.  However, every woman still should have a physical exam from her gynecologist every year. It will include a breast check, inspection of the vulva and vagina to check for abnormal growths or skin changes, a visual exam of the cervix, and then an exam to evaluate the size and shape of the uterus and  ovaries.  And after 49 years of age, a rectal exam is indicated to check for rectal cancers. We will also provide age appropriate advice regarding health maintenance, like when you should have certain vaccines, screening for sexually transmitted diseases, bone density testing, colonoscopy, mammograms, etc.   MAMMOGRAMS:  All women over 49 years old should have a yearly mammogram. Many facilities now offer a "3D" mammogram, which may cost around $50 extra out of pocket. If possible,  we recommend you accept the option to have the 3D mammogram performed.  It both reduces the number of women who will be called back for extra views which then turn out to be normal, and it is better than the routine mammogram at detecting truly abnormal areas.    COLONOSCOPY:  Colonoscopy to screen for colon cancer is recommended for all women at age 49.  We know, you hate the idea of the prep.  We agree, BUT, having colon cancer and not knowing it is worse!!  Colon cancer so often starts as a polyp that can be seen and removed at colonscopy, which can quite literally save your life!  And if your first colonoscopy is normal and you have no family history of colon cancer, most women don't have to have it again for 10 years.  Once every ten years, you can do something that may end up saving your life, right?  We will be happy to help you get it scheduled when you are ready.    Be sure to check your insurance coverage so you understand how much it will cost.  It may be covered as a preventative service at no cost, but you should check your particular policy.    Calcium Content in Foods Calcium is the most abundant mineral in the body. Most of the body's calcium supply is stored in bones and teeth. Calcium helps many parts of the body function normally, including: Blood and blood vessels. Nerves. Hormones. Muscles. Bones and teeth. When your calcium stores are low, you may be at risk for low bone mass, bone loss, and broken bones  (fractures). When you get enough calcium, it helps to support strong bones and teeth throughout your life. Calcium is especially important for: Children during growth spurts. Girls during adolescence. Women who are pregnant or breastfeeding. Women after their menstrual cycle stops (postmenopause). Women whose menstrual cycle has stopped due to anorexia nervosa or regular intense exercise. People who cannot eat or digest dairy products. Vegans. Recommended daily amounts of calcium: Women (ages 19 to 49): 1,000 mg per day. Women (ages 51 and older): 1,200 mg per day. Men (ages 19 to 70): 1,000 mg per day. Men (ages 71 and older): 1,200 mg per day. Women (ages 9 to 18): 1,300 mg per day. Men (ages 9 to 18): 1,300 mg per day. General information Eat foods that are high in calcium. Try to get most of your calcium from food. Some people may benefit from taking calcium supplements. Check with your health care provider or diet and nutrition specialist (dietitian) before starting any calcium supplements. Calcium supplements may interact with certain medicines. Too much calcium may cause other health problems, such as constipation and kidney stones. For the body to absorb calcium, it needs vitamin D. Sources of vitamin D include: Skin exposure to direct sunlight. Foods, such as egg yolks, liver, mushrooms, saltwater fish, and fortified milk. Vitamin D supplements. Check with your health care provider or dietitian before starting any vitamin D supplements. What foods are high in calcium?  Foods that are high in calcium contain more than 100 milligrams per serving. Fruits Fortified orange juice or other fruit juice, 300 mg per 8 oz serving. Vegetables Collard greens, 360 mg per 8 oz serving. Kale, 100 mg per 8 oz serving. Bok choy, 160 mg per 8 oz serving. Grains Fortified ready-to-eat cereals, 100 to 1,000 mg per 8 oz serving. Fortified frozen waffles, 200 mg in 2 waffles. Oatmeal, 140 mg in  1 cup. Meats and other proteins Sardines, canned with bones, 325 mg per 3 oz serving. Salmon, canned with bones, 180 mg per 3 oz serving. Canned shrimp, 125 mg per 3 oz serving. Baked beans, 160 mg per 4 oz serving. Tofu, firm, made with calcium sulfate, 253 mg per 4 oz serving. Dairy Yogurt, plain, low-fat, 310 mg per 6 oz serving. Nonfat milk, 300 mg per 8 oz serving. American cheese, 195 mg per 1 oz serving. Cheddar cheese, 205 mg per 1 oz serving. Cottage cheese 2%, 105 mg per 4 oz serving. Fortified soy, rice, or almond milk, 300 mg per 8 oz serving. Mozzarella, part skim, 210 mg per 1 oz serving. The items listed above may not be a complete list of foods high in calcium. Actual amounts of calcium may be different depending on processing. Contact a dietitian for more information. What foods are lower in calcium? Foods that are lower in calcium contain 50 mg or less per serving. Fruits Apple, about 6 mg. Banana, about 12 mg.   Vegetables Lettuce, 19 mg per 2 oz serving. Tomato, about 11 mg. Grains Rice, 4 mg per 6 oz serving. Boiled potatoes, 14 mg per 8 oz serving. White bread, 6 mg per slice. Meats and other proteins Egg, 27 mg per 2 oz serving. Red meat, 7 mg per 4 oz serving. Chicken, 17 mg per 4 oz serving. Fish, cod, or trout, 20 mg per 4 oz serving. Dairy Cream cheese, regular, 14 mg per 1 Tbsp serving. Brie cheese, 50 mg per 1 oz serving. Parmesan cheese, 70 mg per 1 Tbsp serving. The items listed above may not be a complete list of foods lower in calcium. Actual amounts of calcium may be different depending on processing. Contact a dietitian for more information. Summary Calcium is an important mineral in the body because it affects many functions. Getting enough calcium helps support strong bones and teeth throughout your life. Try to get most of your calcium from food. Calcium supplements may interact with certain medicines. Check with your health care provider  or dietitian before starting any calcium supplements. This information is not intended to replace advice given to you by your health care provider. Make sure you discuss any questions you have with your health care provider. Document Revised: 06/09/2019 Document Reviewed: 06/09/2019 Elsevier Patient Education  2023 [employer] Inc[employer]  

## 2022-02-20 DIAGNOSIS — M19012 Primary osteoarthritis, left shoulder: Secondary | ICD-10-CM | POA: Diagnosis not present

## 2022-02-20 DIAGNOSIS — G8918 Other acute postprocedural pain: Secondary | ICD-10-CM | POA: Diagnosis not present

## 2022-02-20 DIAGNOSIS — M7552 Bursitis of left shoulder: Secondary | ICD-10-CM | POA: Diagnosis not present

## 2022-02-20 DIAGNOSIS — M65812 Other synovitis and tenosynovitis, left shoulder: Secondary | ICD-10-CM | POA: Diagnosis not present

## 2022-02-20 DIAGNOSIS — M7522 Bicipital tendinitis, left shoulder: Secondary | ICD-10-CM | POA: Diagnosis not present

## 2022-02-20 DIAGNOSIS — M7542 Impingement syndrome of left shoulder: Secondary | ICD-10-CM | POA: Diagnosis not present

## 2022-02-20 HISTORY — PX: SHOULDER ARTHROSCOPY: SHX128

## 2022-02-27 DIAGNOSIS — M25512 Pain in left shoulder: Secondary | ICD-10-CM | POA: Diagnosis not present

## 2022-02-27 DIAGNOSIS — M25612 Stiffness of left shoulder, not elsewhere classified: Secondary | ICD-10-CM | POA: Diagnosis not present

## 2022-03-04 DIAGNOSIS — M25512 Pain in left shoulder: Secondary | ICD-10-CM | POA: Diagnosis not present

## 2022-03-08 DIAGNOSIS — M25512 Pain in left shoulder: Secondary | ICD-10-CM | POA: Diagnosis not present

## 2022-03-12 DIAGNOSIS — M25512 Pain in left shoulder: Secondary | ICD-10-CM | POA: Diagnosis not present

## 2022-03-18 DIAGNOSIS — M25512 Pain in left shoulder: Secondary | ICD-10-CM | POA: Diagnosis not present

## 2022-03-20 DIAGNOSIS — M25512 Pain in left shoulder: Secondary | ICD-10-CM | POA: Diagnosis not present

## 2022-03-26 DIAGNOSIS — M25512 Pain in left shoulder: Secondary | ICD-10-CM | POA: Diagnosis not present

## 2022-03-29 DIAGNOSIS — M25512 Pain in left shoulder: Secondary | ICD-10-CM | POA: Diagnosis not present

## 2022-07-30 DIAGNOSIS — D2239 Melanocytic nevi of other parts of face: Secondary | ICD-10-CM | POA: Diagnosis not present

## 2022-07-30 DIAGNOSIS — D225 Melanocytic nevi of trunk: Secondary | ICD-10-CM | POA: Diagnosis not present

## 2022-07-30 DIAGNOSIS — D2272 Melanocytic nevi of left lower limb, including hip: Secondary | ICD-10-CM | POA: Diagnosis not present

## 2022-08-06 DIAGNOSIS — J301 Allergic rhinitis due to pollen: Secondary | ICD-10-CM | POA: Diagnosis not present

## 2022-08-06 DIAGNOSIS — J3081 Allergic rhinitis due to animal (cat) (dog) hair and dander: Secondary | ICD-10-CM | POA: Diagnosis not present

## 2022-08-06 DIAGNOSIS — J3089 Other allergic rhinitis: Secondary | ICD-10-CM | POA: Diagnosis not present

## 2022-09-20 NOTE — Progress Notes (Signed)
GYNECOLOGY  VISIT   HPI: 52 y.o.   Married  Caucasian  female   G2P2 with No LMP recorded (lmp unknown). Patient has had a hysterectomy.   here for   discuss symptoms menopause- increased hot flashes. Waking up hot at night.  Tired. Feels foggy during the day.  Intercourse is uncomfortable.  Joint pain.   FSh 133 on 11/09/19.  Still has her cervix.  She had some spotting for the first year following her supracervical hysterectomy.   GYNECOLOGIC HISTORY: No LMP recorded (lmp unknown). Patient has had a hysterectomy. Contraception:  hyst Menopausal hormone therapy:  n/a Last mammogram:  11/16/21 Breast Density Cat C, BI-RADS CAT 1 neg Last pap smear:   09/15/18 neg: HR HPV neg        OB History     Gravida  2   Para  2   Term      Preterm      AB      Living  2      SAB      IAB      Ectopic      Multiple      Live Births                 There are no problems to display for this patient.   Past Medical History:  Diagnosis Date   COVID-19 virus infection    2020, Jan 2022    Past Surgical History:  Procedure Laterality Date   ABDOMINAL HYSTERECTOMY  2010   BREAST BIOPSY Left 06/04/2012   CESAREAN SECTION  2007 2010   x2   KNEE ARTHROSCOPY Right 1998   SHOULDER ARTHROSCOPY Left 02/20/2022    Current Outpatient Medications  Medication Sig Dispense Refill   Ascorbic Acid (VITAMIN C) 1000 MG tablet Take 1,000 mg by mouth daily.     cetirizine (ZYRTEC) 10 MG tablet Take 10 mg by mouth daily.     Cholecalciferol (VITAMIN D3) 125 MCG (5000 UT) TABS Take 1 tablet by mouth daily.     Estradiol (VAGIFEM) 10 MCG TABS vaginal tablet Place 1 tablet (10 mcg total) vaginally 2 (two) times a week. Use every night before bed for two weeks when you first begin this medicine, then after the first two weeks, begin using it twice a week. 18 tablet 3   estradiol (VIVELLE-DOT) 0.05 MG/24HR patch Place 1 patch (0.05 mg total) onto the skin 2 (two) times a week. 8  patch 3   Omega-3 Fatty Acids (FISH OIL) 500 MG CAPS      Turmeric 500 MG CAPS      Zinc Sulfate (ZINC 15 PO) Take 1 capsule by mouth daily.     No current facility-administered medications for this visit.     ALLERGIES: Biaxin [clarithromycin], Keflex [cephalexin], and Sulfamethoxazole-trimethoprim  Family History  Problem Relation Age of Onset   Hypertension Mother    Diabetes Mother    Hyperlipidemia Mother    Diabetes Father    Psoriasis Father    Pulmonary embolism Father     Social History   Socioeconomic History   Marital status: Married    Spouse name: Not on file   Number of children: Not on file   Years of education: Not on file   Highest education level: Not on file  Occupational History   Not on file  Tobacco Use   Smoking status: Never   Smokeless tobacco: Never  Vaping Use   Vaping status: Never Used  Substance and Sexual Activity   Alcohol use: No    Alcohol/week: 0.0 standard drinks of alcohol   Drug use: No   Sexual activity: Yes    Partners: Male    Birth control/protection: Surgical    Comment: Hyst--supracervical  Other Topics Concern   Not on file  Social History Narrative   Not on file   Social Determinants of Health   Financial Resource Strain: Not on file  Food Insecurity: Not on file  Transportation Needs: Not on file  Physical Activity: Not on file  Stress: Not on file  Social Connections: Not on file  Intimate Partner Violence: Not on file    Review of Systems  All other systems reviewed and are negative.   PHYSICAL EXAMINATION:    BP 112/72 (BP Location: Left Arm, Patient Position: Sitting, Cuff Size: Normal)   Pulse 69   Ht 5\' 6"  (1.676 m)   Wt 147 lb (66.7 kg)   LMP  (LMP Unknown)   SpO2 99%   BMI 23.73 kg/m     General appearance: alert, cooperative and appears stated age  ASSESSMENT  Menopausal symptoms.  Status post supracervical hysterectomy.   PLAN  We discussed menopause changes.  Tx options  reviewed:  ERT (HRT if has bleeding on ERT), SSRI/SNRi, Gabapentin, Veozah.  Will do ERT, Vivelle Dot 0.05 mg twice weekly.  #8, RF 3,  Start Vagifem pv at hs x 2 weeks and then twice weekly.  We discussed potential risk of stroke, DVT, and PE.  Fu for annual exam and prn.    24 min  total time was spent for this patient encounter, including preparation, face-to-face counseling with the patient, coordination of care, and documentation of the encounter.

## 2022-10-03 ENCOUNTER — Encounter: Payer: Self-pay | Admitting: Obstetrics and Gynecology

## 2022-10-03 ENCOUNTER — Ambulatory Visit: Payer: BC Managed Care – PPO | Admitting: Obstetrics and Gynecology

## 2022-10-03 VITALS — BP 112/72 | HR 69 | Ht 66.0 in | Wt 147.0 lb

## 2022-10-03 DIAGNOSIS — N951 Menopausal and female climacteric states: Secondary | ICD-10-CM | POA: Diagnosis not present

## 2022-10-03 MED ORDER — ESTRADIOL 10 MCG VA TABS: 1.0000 | ORAL_TABLET | VAGINAL | 3 refills | Status: DC

## 2022-10-03 MED ORDER — ESTRADIOL 0.05 MG/24HR TD PTTW: 1.0000 | MEDICATED_PATCH | TRANSDERMAL | 3 refills | Status: DC

## 2022-10-03 NOTE — Patient Instructions (Signed)
Mediterranean Diet A Mediterranean diet is based on the traditions of countries on the [employer]. It focuses on eating more: Fruits and vegetables. Whole grains, beans, nuts, and seeds. Heart-healthy fats. These are fats that are good for your heart. It involves eating less: Dairy. Meat and eggs. Processed foods with added sugar, salt, and fat. This type of diet can help prevent certain conditions. It can also improve outcomes if you have a long-term (chronic) disease, such as kidney or heart disease. What are tips for following this plan? Reading food labels Check packaged foods for: The serving size. For foods such as rice and pasta, the serving size is the amount of cooked product, not dry. The total fat. Avoid foods with saturated fat or trans fat. Added sugars, such as corn syrup. Shopping  Try to have a balanced diet. Buy a variety of foods, such as: Fresh fruits and vegetables. You may be able to get these from local farmers markets. You can also buy them frozen. Grains, beans, nuts, and seeds. Some of these can be bought in bulk. Fresh seafood. Poultry and eggs. Low-fat dairy products. Buy whole ingredients instead of foods that have already been packaged. If you can't get fresh seafood, buy precooked frozen shrimp or canned fish, such as tuna, salmon, or sardines. Stock your pantry so you always have certain foods on hand, such as olive oil, canned tuna, canned tomatoes, rice, pasta, and beans. Cooking Cook foods with extra-virgin olive oil instead of using butter or other vegetable oils. Have meat as a side dish. Have vegetables or grains as your main dish. This means having meat in small portions or adding small amounts of meat to foods like pasta or stew. Use beans or vegetables instead of meat in common dishes like chili or lasagna. Try out different cooking methods. Try roasting, broiling, steaming, and sauting vegetables. Add frozen vegetables to soups, stews,  pasta, or rice. Add nuts or seeds for added healthy fats and plant protein at each meal. You can add these to yogurt, salads, or vegetable dishes. Marinate fish or vegetables using olive oil, lemon juice, garlic, and fresh herbs. Meal planning Plan to eat a vegetarian meal one day each week. Try to work up to two vegetarian meals, if possible. Eat seafood two or more times a week. Have healthy snacks on hand. These may include: Vegetable sticks with hummus. Greek yogurt. Fruit and nut trail mix. Eat balanced meals. These should include: Fruit: 2-3 servings a day. Vegetables: 4-5 servings a day. Low-fat dairy: 2 servings a day. Fish, poultry, or lean meat: 1 serving a day. Beans and legumes: 2 or more servings a week. Nuts and seeds: 1-2 servings a day. Whole grains: 6-8 servings a day. Extra-virgin olive oil: 3-4 servings a day. Limit red meat and sweets to just a few servings a month. Lifestyle  Try to cook and eat meals with your family. Drink enough fluid to keep your pee (urine) pale yellow. Be active every day. This includes: Aerobic exercise, which is exercise that causes your heart to beat faster. Examples include running and swimming. Leisure activities like gardening, walking, or housework. Get 7-8 hours of sleep each night. Drink red wine if your provider says you can. A glass of wine is 5 oz (150 mL). You may be allowed to have: Up to 1 glass a day if you're female and not pregnant. Up to 2 glasses a day if you're female. What foods should I eat? Fruits Apples. Apricots. Avocado.  Berries. Bananas. Cherries. Dates. Figs. Grapes. Lemons. Melon. Oranges. Peaches. Plums. Pomegranate. Vegetables Artichokes. Beets. Broccoli. Cabbage. Carrots. Eggplant. Green beans. Chard. Kale. Spinach. Onions. Leeks. Peas. Squash. Tomatoes. Peppers. Radishes. Grains Whole-grain pasta. Brown rice. Bulgur wheat. Polenta. Couscous. Whole-wheat bread. Orpah Cobb. Meats and other  proteins Beans. Almonds. Sunflower seeds. Pine nuts. Peanuts. Cod. Salmon. Scallops. Shrimp. Tuna. Tilapia. Clams. Oysters. Eggs. Chicken or [place] without skin. Dairy Low-fat milk. Cheese. Greek yogurt. Fats and oils Extra-virgin olive oil. Avocado oil. Grapeseed oil. Beverages Water. Red wine. Herbal tea. Sweets and desserts Greek yogurt with honey. Baked apples. Poached pears. Trail mix. Seasonings and condiments Basil. Cilantro. Coriander. Cumin. Mint. Parsley. Sage. Rosemary. Tarragon. Garlic. Oregano. Thyme. Pepper. Balsamic vinegar. Tahini. Hummus. Tomato sauce. Olives. Mushrooms. The items listed above may not be all the foods and drinks you can have. Talk to a dietitian to learn more. What foods should I limit? This is a list of foods that should be eaten rarely. Fruits Fruit canned in syrup. Vegetables Deep-fried potatoes, like [place] fries. Grains Packaged pasta or rice dishes. Cereal with added sugar. Snacks with added sugar. Meats and other proteins Beef. Pork. [place]. Chicken or [place] with skin. Hot dogs. Tomasa Blase. Dairy Ice cream. Sour cream. Whole milk. Fats and oils Butter. Canola oil. Vegetable oil. Beef fat (tallow). Lard. Beverages Juice. Sugar-sweetened soft drinks. Beer. Liquor and spirits. Sweets and desserts Cookies. Cakes. Pies. Candy. Seasonings and condiments Mayonnaise. Pre-made sauces and marinades. The items listed above may not be all the foods and drinks you should limit. Talk to a dietitian to learn more. Where to find more information American Heart Association (AHA): heart.org This information is not intended to replace advice given to you by your health care provider. Make sure you discuss any questions you have with your health care provider. Document Revised: 05/26/2022 Document Reviewed: 05/26/2022 Elsevier Patient Education  2024 Elsevier Inc.   Menopause and Hormone Replacement Therapy Menopause is a normal time of life when menstrual  periods stop completely and the ovaries stop producing the female hormones estrogen and progesterone. Low levels of these hormones can affect your health and cause symptoms. Hormone replacement therapy (HRT) can relieve some of those symptoms. HRT is the use of artificial (synthetic) hormones to replace hormones that your body has stopped producing because you have reached menopause. Types of HRT  HRT may consist of the synthetic hormones estrogen and progestin, or it may consist of estrogen-only therapy. You and your health care provider will decide which form of HRT is best for you. If you choose to be on HRT and you have a uterus, estrogen and progestin are usually prescribed. Estrogen-only therapy is used for women who do not have a uterus. Possible options for taking HRT include: Pills. Patches. Gels. Sprays. Vaginal cream. Vaginal rings. Vaginal inserts. The amount of hormones that you take and how long you take them varies according to your health. It is important to: Begin HRT with the lowest possible dosage. Stop HRT as soon as your health care provider tells you to stop. Work with your health care provider so that you feel informed and comfortable with your decisions. Tell a health care provider about: Any allergies you have. Whether you have had blood clots or know of any risk factors you may have for blood clots. Whether you or family members have had cancer, especially cancer of the breasts, ovaries, or uterus. Any surgeries you have had. All medicines you are taking, including vitamins, herbs, eye drops, creams,  and over-the-counter medicines. Whether you are pregnant or may be pregnant. Any medical conditions you have. What are the benefits? HRT can reduce the frequency and severity of menopausal symptoms. Benefits of HRT vary according to the kind of symptoms that you have, how severe they are, and your overall health. HRT may help to improve the following symptoms of  menopause: Hot flashes and night sweats. These are sudden feelings of heat that spread over the face and body. The skin may turn red, like a blush. Night sweats are hot flashes that happen while you are sleeping or trying to sleep. Bone loss (osteoporosis). The body loses calcium more quickly after menopause, causing the bones to become weaker. This can increase the risk for bone breaks (fractures). Vaginal dryness. The lining of the vagina can become thin and dry, which can cause pain during sex or cause infection, burning, or itching. Urinary tract infections. Urinary incontinence. This is the inability to control when you urinate. Irritability. Short-term memory problems. What are the risks? Risks of HRT vary depending on your individual health and medical history. Risks of HRT also depend on whether you receive both estrogen and progestin or you receive estrogen only. HRT may increase the risk of: Spotting. This is when a small amount of blood leaks from the vagina unexpectedly. Endometrial cancer. This cancer is in the lining of the uterus (endometrium). Breast cancer. Increased density of breast tissue. This can make it harder to find breast cancer on a breast X-ray (mammogram). Stroke. Heart disease. Blood clots. Gallbladder disease or liver disease. Risks of HRT can increase if you have any of the following conditions: Endometrial cancer. Liver disease. Heart disease. Breast cancer. History of blood clots. History of stroke. Follow these instructions at home: Pap tests Have Pap tests done as often as told by your health care provider. A Pap test is sometimes called a Pap smear. It is a screening test that is used to check for signs of cancer of the cervix and vagina. A Pap test can also identify the presence of infection or precancerous changes. Pap tests may be done: Every 3 years, starting at age 70. Every 5 years, starting after age 42, in combination with testing for human  papillomavirus (HPV). More often or less often depending on other medical conditions you have, your age, and other risk factors. It is up to you to get the results of your Pap test. Ask your health care provider, or the department that is doing the test, when your results will be ready. General instructions Take over-the-counter and prescription medicines only as told by your health care provider. Do not use any products that contain nicotine or tobacco. These products include cigarettes, chewing tobacco, and vaping devices, such as e-cigarettes. If you need help quitting, ask your health care provider. Get mammograms, pelvic exams, and medical checkups as often as told by your health care provider. Keep all follow-up visits. This is important. Contact a health care provider if you have: Pain or swelling in your legs. Lumps or changes in your breasts or armpits. Pain, burning, or bleeding when you urinate. Unusual vaginal bleeding. Dizziness or headaches. Pain in your abdomen. Get help right away if you have: Shortness of breath. Chest pain. Slurred speech. Weakness or numbness in any part of your arms or legs. These symptoms may represent a serious problem that is an emergency. Do not wait to see if the symptoms will go away. Get medical help right away. Call your local emergency  services (911 in the [place] Do not drive yourself to the hospital. Summary Menopause is a normal time of life when menstrual periods stop completely and the ovaries stop producing the female hormones estrogen and progesterone. HRT can reduce the frequency and severity of menopausal symptoms. Risks of HRT vary depending on your individual health and medical history. This information is not intended to replace advice given to you by your health care provider. Make sure you discuss any questions you have with your health care provider. Document Revised: 08/16/2019 Document Reviewed: 08/16/2019 Elsevier Patient  Education  2024 [employer].

## 2022-10-09 ENCOUNTER — Other Ambulatory Visit: Payer: Self-pay | Admitting: Obstetrics and Gynecology

## 2022-10-09 DIAGNOSIS — Z1231 Encounter for screening mammogram for malignant neoplasm of breast: Secondary | ICD-10-CM

## 2022-10-22 ENCOUNTER — Encounter: Payer: Self-pay | Admitting: Obstetrics and Gynecology

## 2022-10-22 ENCOUNTER — Other Ambulatory Visit: Payer: Self-pay | Admitting: Obstetrics and Gynecology

## 2022-10-22 MED ORDER — ESTRADIOL 0.05 MG/24HR TD PTTW: 1.0000 | MEDICATED_PATCH | TRANSDERMAL | 0 refills | Status: DC

## 2022-10-22 MED ORDER — ESTRADIOL 10 MCG VA TABS: 1.0000 | ORAL_TABLET | VAGINAL | 0 refills | Status: DC

## 2022-10-22 NOTE — Telephone Encounter (Signed)
Med refill request: HRTs Last AEX: 12/11/2021 Next AEX: 12/31/2022 Last MMG (if hormonal med) 11/16/2021-neg birads 1; Cat C Refill authorized: Please Advise?

## 2022-11-15 DIAGNOSIS — Z1231 Encounter for screening mammogram for malignant neoplasm of breast: Secondary | ICD-10-CM

## 2022-11-22 ENCOUNTER — Ambulatory Visit
Admission: RE | Admit: 2022-11-22 | Discharge: 2022-11-22 | Disposition: A | Discharge status: Home / Self Care | Payer: BC Managed Care – PPO | Source: Ambulatory Visit | Attending: Obstetrics and Gynecology | Admitting: Obstetrics and Gynecology

## 2022-11-22 DIAGNOSIS — Z1231 Encounter for screening mammogram for malignant neoplasm of breast: Secondary | ICD-10-CM | POA: Diagnosis not present

## 2022-12-17 DIAGNOSIS — Z1212 Encounter for screening for malignant neoplasm of rectum: Secondary | ICD-10-CM | POA: Diagnosis not present

## 2022-12-17 DIAGNOSIS — Z1389 Encounter for screening for other disorder: Secondary | ICD-10-CM | POA: Diagnosis not present

## 2022-12-17 DIAGNOSIS — E785 Hyperlipidemia, unspecified: Secondary | ICD-10-CM | POA: Diagnosis not present

## 2022-12-17 NOTE — Progress Notes (Signed)
52 y.o. G2P2 Married Caucasian female here for annual exam.    Patient is followed for ERT with Vivelle Dot and for Vagifem use.  Hot flashes are controlled.  No vaginal bleeding.  Had a supracervical hysterectomy.   Just had her physical and had routine blood work with her PCP.   PCP: Garlan Fillers, MD   No LMP recorded (lmp unknown). Patient has had a hysterectomy.           Sexually active: Yes.    The current method of family planning is status post hysterectomy.    Exercising: Yes.     Weight training, cardio Smoker:  no  OB History  Gravida Para Term Preterm AB Living  2 2       2   SAB IAB Ectopic Multiple Live Births               # Outcome Date GA Lbr Len/2nd Weight Sex Type Anes PTL Lv  2 Para           1 Para              Health Maintenance: Pap:   09/15/18 Neg:Neg HR HPV, 07/05/15 Neg:Neg HR HPV  History of abnormal Pap:  no MMG:  11/22/22 density C Bi-rads 1 neg  Colonoscopy:  05/2021 per pt - due in 5 - 10 years.     BMD:  n/a  Result  n/a  HIV: neg in preg Hep C: declined  Immunization History  Administered Date(s) Administered   Influenza, High Dose Seasonal PF 01/19/2018, 01/26/2019   Janssen (J&J) SARS-COV-2 Vaccination 10/31/2020  Had flu vaccine.   Declines Covid vaccine.     reports that she has never smoked. She has never used smokeless tobacco. She reports that she does not drink alcohol and does not use drugs.  Past Medical History:  Diagnosis Date   COVID-19 virus infection    2020, Jan 2022    Past Surgical History:  Procedure Laterality Date   ABDOMINAL HYSTERECTOMY  2010   BREAST BIOPSY Left 06/04/2012   CESAREAN SECTION  2007 2010   x2   KNEE ARTHROSCOPY Right 1998   SHOULDER ARTHROSCOPY Left 02/20/2022    Current Outpatient Medications  Medication Sig Dispense Refill   Ascorbic Acid (VITAMIN C) 1000 MG tablet Take 1,000 mg by mouth daily.     cetirizine (ZYRTEC) 10 MG tablet Take 10 mg by mouth daily.      Cholecalciferol (VITAMIN D3) 125 MCG (5000 UT) TABS Take 1 tablet by mouth daily.     Estradiol (VAGIFEM) 10 MCG TABS vaginal tablet Place 1 tablet (10 mcg total) vaginally 2 (two) times a week. 24 tablet 0   estradiol (VIVELLE-DOT) 0.05 MG/24HR patch Place 1 patch (0.05 mg total) onto the skin 2 (two) times a week. 24 patch 0   Omega-3 Fatty Acids (FISH OIL) 500 MG CAPS      Turmeric 500 MG CAPS      Zinc Sulfate (ZINC 15 PO) Take 1 capsule by mouth daily.     No current facility-administered medications for this visit.    Family History  Problem Relation Age of Onset   Hypertension Mother    Diabetes Mother    Hyperlipidemia Mother    Diabetes Father    Psoriasis Father    Pulmonary embolism Father     Review of Systems  All other systems reviewed and are negative.   Exam:   BP 124/84 (BP Location: Left  Arm, Patient Position: Sitting, Cuff Size: Normal)   Pulse 71   Ht 5\' 8"  (1.727 m)   Wt 145 lb (65.8 kg)   LMP  (LMP Unknown)   SpO2 100%   BMI 22.05 kg/m     General appearance: alert, cooperative and appears stated age Head: normocephalic, without obvious abnormality, atraumatic Neck: no adenopathy, supple, symmetrical, trachea midline and thyroid normal to inspection and palpation Lungs: clear to auscultation bilaterally Breasts: normal appearance, no masses or tenderness, No nipple retraction or dimpling, No nipple discharge or bleeding, No axillary adenopathy Heart: regular rate and rhythm Abdomen: soft, non-tender; no masses, no organomegaly Extremities: extremities normal, atraumatic, no cyanosis or edema Skin: skin color, texture, turgor normal. No rashes or lesions Lymph nodes: cervical, supraclavicular, and axillary nodes normal. Neurologic: grossly normal  Pelvic: External genitalia:  no lesions              No abnormal inguinal nodes palpated.              Urethra:  normal appearing urethra with no masses, tenderness or lesions              Bartholins and  Skenes: normal                 Vagina: normal appearing vagina with normal color and discharge, no lesions              Cervix: no lesions              Pap taken: no Bimanual Exam:  Uterus:  absent              Adnexa: no mass, fullness, tenderness              Rectal exam: yes.  Confirms.              Anus:  normal sphincter tone, no lesions  Chaperone was present for exam:  Warren Lacy, CMA   Assessment:  Status post C-Hyst with supracervical hysterectomy.  ERT.  Vaginal atrophy.   Plan: Mammogram screening discussed. Self breast awareness reviewed. Guidelines for Calcium, Vitamin D, regular exercise program including cardiovascular and weight bearing exercise. Discused WHI and use of RRT which can increase risk of PE, DVT, MI, stroke. Refill of Vivelle Dot .05 mg twice weekly for one year.  Refill of Vagifem twice weekly for one year.  Pap and HR HPV testing in 2025.  FU yearly and prn.

## 2022-12-24 DIAGNOSIS — Z23 Encounter for immunization: Secondary | ICD-10-CM | POA: Diagnosis not present

## 2022-12-24 DIAGNOSIS — Z0000 Encounter for general adult medical examination without abnormal findings: Secondary | ICD-10-CM | POA: Diagnosis not present

## 2022-12-24 DIAGNOSIS — R82998 Other abnormal findings in urine: Secondary | ICD-10-CM | POA: Diagnosis not present

## 2022-12-31 ENCOUNTER — Encounter: Payer: Self-pay | Admitting: Obstetrics and Gynecology

## 2022-12-31 ENCOUNTER — Ambulatory Visit (INDEPENDENT_AMBULATORY_CARE_PROVIDER_SITE_OTHER): Payer: BC Managed Care – PPO | Admitting: Obstetrics and Gynecology

## 2022-12-31 VITALS — BP 124/84 | HR 71 | Ht 68.0 in | Wt 145.0 lb

## 2022-12-31 DIAGNOSIS — Z79899 Other long term (current) drug therapy: Secondary | ICD-10-CM | POA: Diagnosis not present

## 2022-12-31 DIAGNOSIS — Z01419 Encounter for gynecological examination (general) (routine) without abnormal findings: Secondary | ICD-10-CM | POA: Diagnosis not present

## 2022-12-31 MED ORDER — ESTRADIOL 0.05 MG/24HR TD PTTW: 1.0000 | MEDICATED_PATCH | TRANSDERMAL | 3 refills | Status: DC

## 2022-12-31 MED ORDER — ESTRADIOL 10 MCG VA TABS: 1.0000 | ORAL_TABLET | VAGINAL | 3 refills | Status: DC

## 2022-12-31 NOTE — Patient Instructions (Signed)

## 2023-01-03 DIAGNOSIS — R82998 Other abnormal findings in urine: Secondary | ICD-10-CM | POA: Diagnosis not present

## 2023-01-10 ENCOUNTER — Encounter: Payer: Self-pay | Admitting: Obstetrics and Gynecology

## 2023-01-17 DIAGNOSIS — R3129 Other microscopic hematuria: Secondary | ICD-10-CM | POA: Diagnosis not present

## 2023-06-17 DIAGNOSIS — H04123 Dry eye syndrome of bilateral lacrimal glands: Secondary | ICD-10-CM | POA: Diagnosis not present

## 2023-07-28 IMAGING — MG MM DIGITAL SCREENING BILAT W/ TOMO AND CAD
8 series · 9 of 24 positions shown · non-contrast
Comparison: Previous exam(s).

CLINICAL DATA: Screening.

EXAM:
DIGITAL SCREENING BILATERAL MAMMOGRAM WITH TOMOSYNTHESIS AND CAD
TECHNIQUE: Bilateral screening digital craniocaudal and mediolateral oblique
mammograms were obtained. Bilateral screening digital breast
tomosynthesis was performed. The images were evaluated with
computer-aided detection.

[L CC synth-2D]
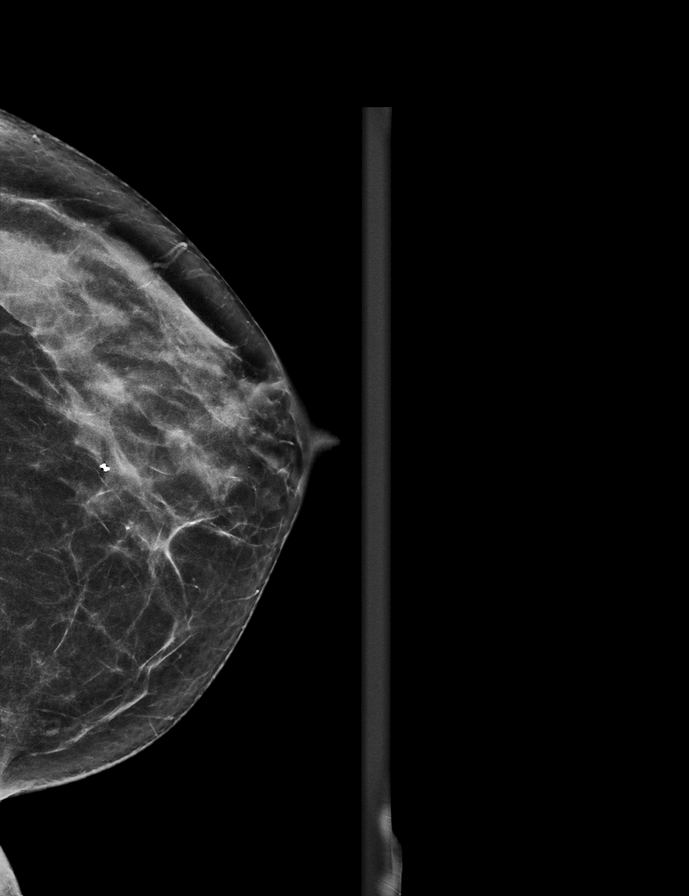

[R CC synth-2D]
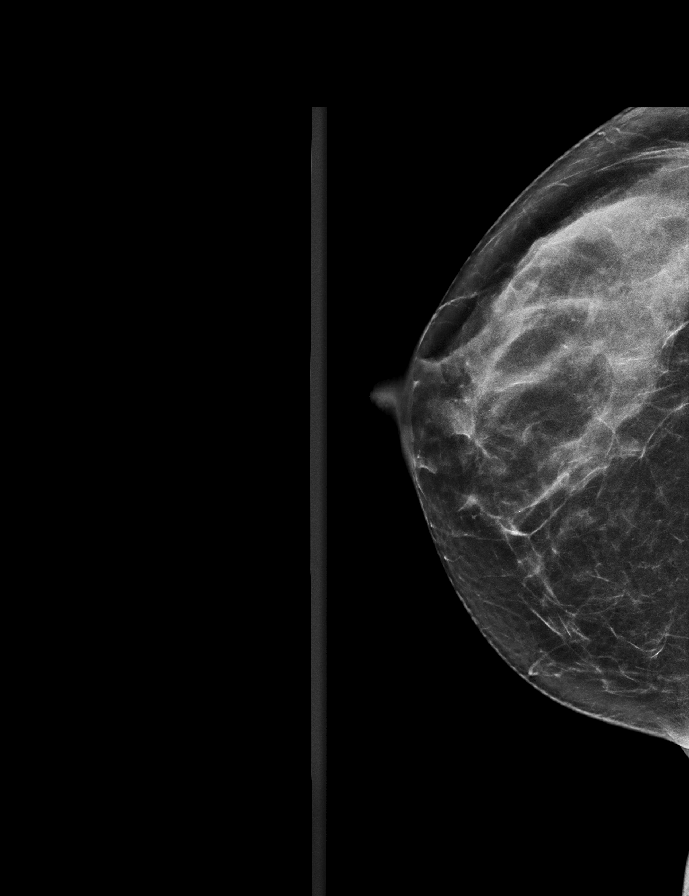

[L MLO synth-2D]
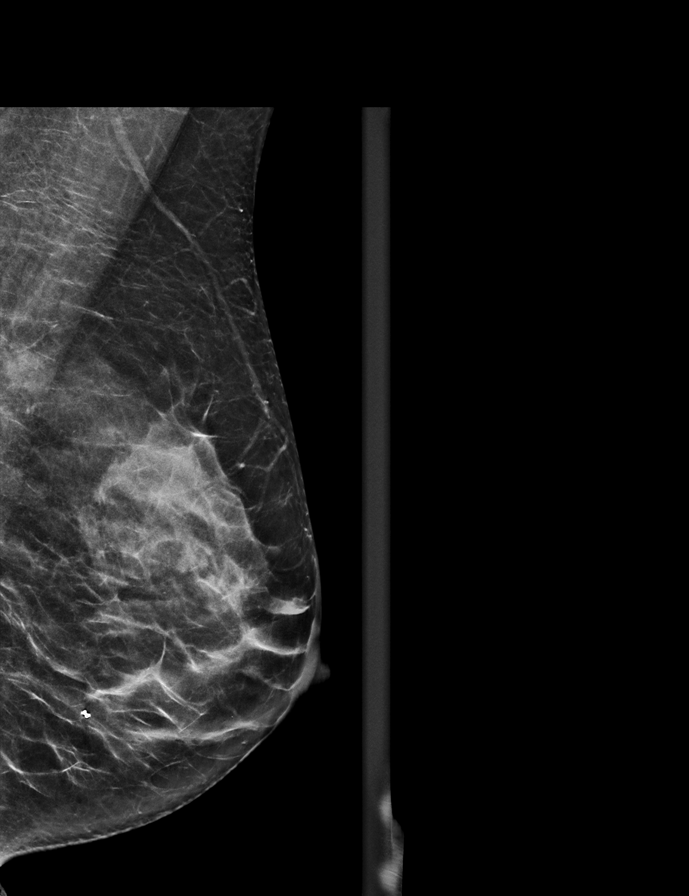

[R MLO synth-2D]
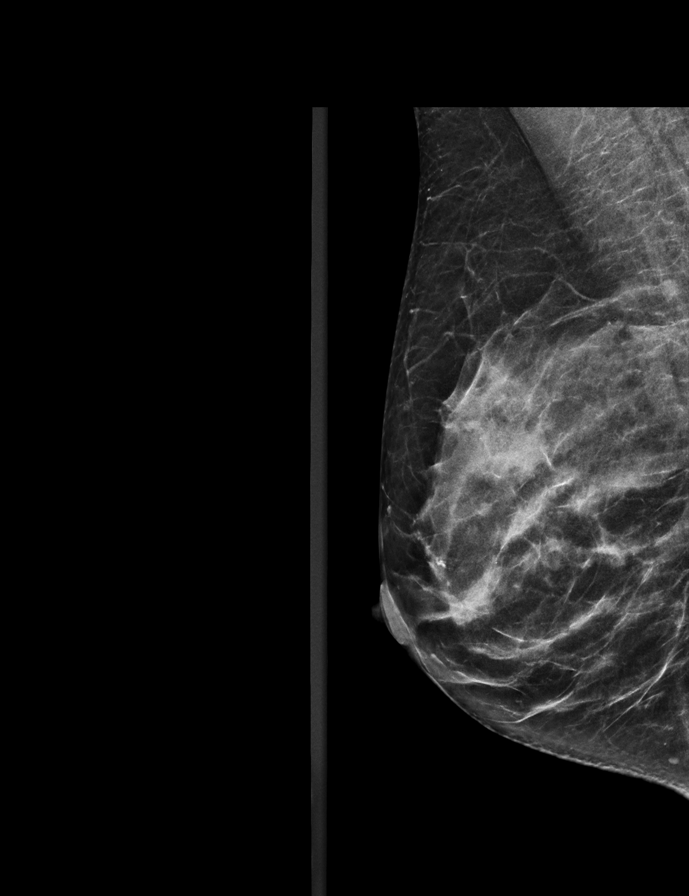

[L CC tomo · 2 of 57 frames shown]
[frame 19/57]
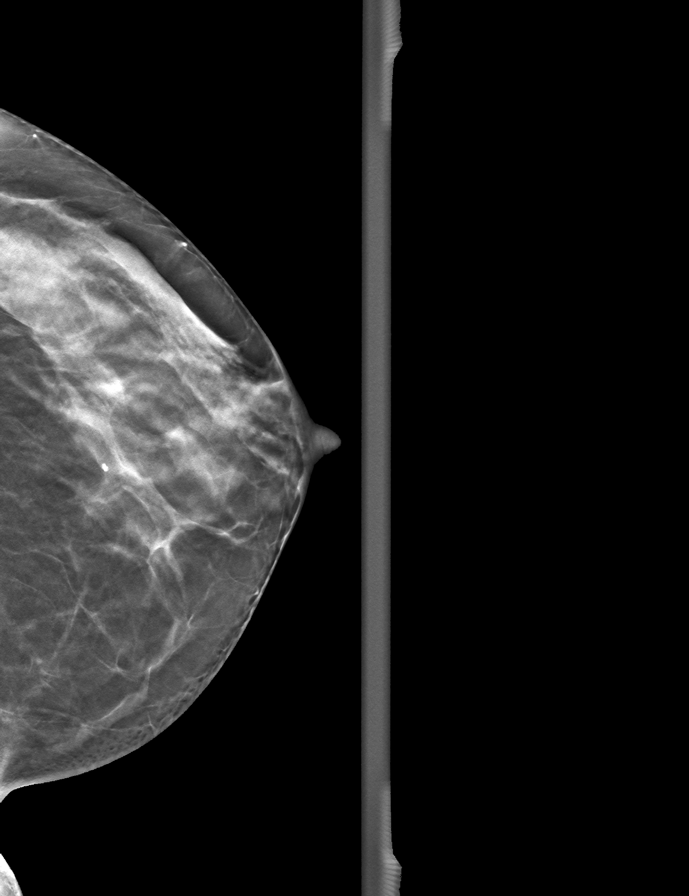
[frame 29/57]
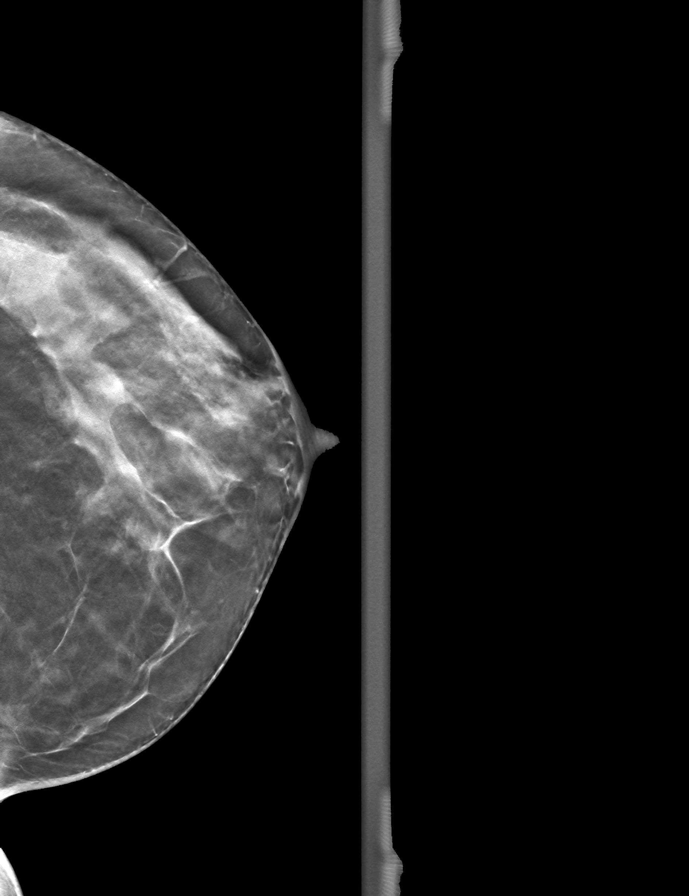

[L MLO tomo · tomo slice 31/60.0]
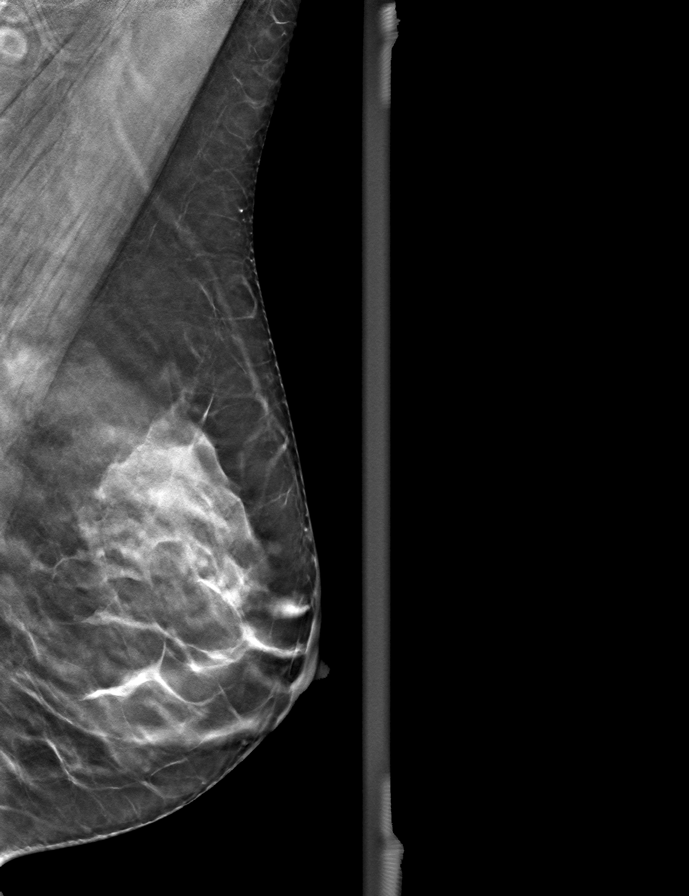

[R MLO tomo · tomo slice 30/59.0]
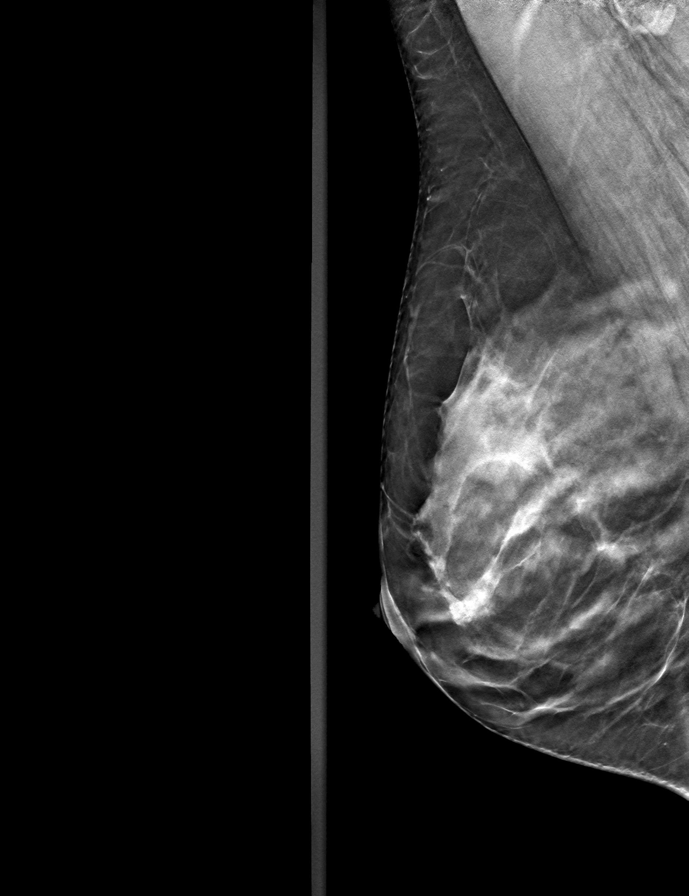

[R CC tomo · tomo slice 31/60.0]
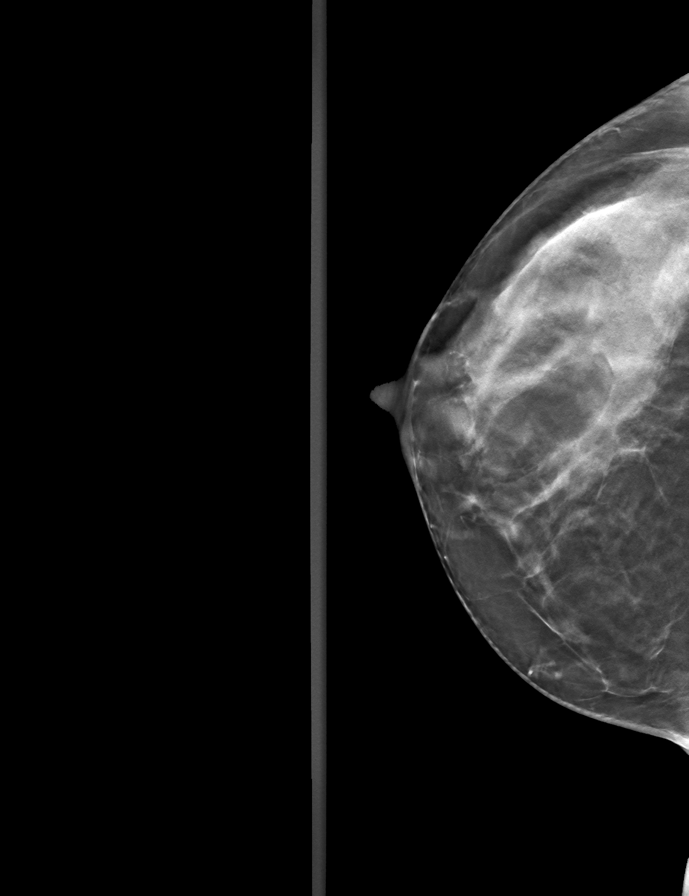

[9 of 24 positions shown; findings below may reference images not displayed]

ACR Breast Density Category c: The breast tissue is heterogeneously
dense, which may obscure small masses.
FINDINGS: There are no findings suspicious for malignancy.
IMPRESSION: No mammographic evidence of malignancy. A result letter of this
screening mammogram will be mailed directly to the patient.

RECOMMENDATION:
Screening mammogram in one year. (Code:[id number])

BI-RADS CATEGORY  1: Negative.

## 2023-09-30 DIAGNOSIS — M25521 Pain in right elbow: Secondary | ICD-10-CM | POA: Diagnosis not present

## 2023-10-07 DIAGNOSIS — M25521 Pain in right elbow: Secondary | ICD-10-CM | POA: Diagnosis not present

## 2023-10-14 DIAGNOSIS — M25521 Pain in right elbow: Secondary | ICD-10-CM | POA: Diagnosis not present

## 2023-10-21 DIAGNOSIS — M25521 Pain in right elbow: Secondary | ICD-10-CM | POA: Diagnosis not present

## 2023-10-28 DIAGNOSIS — M25521 Pain in right elbow: Secondary | ICD-10-CM | POA: Diagnosis not present

## 2023-11-04 DIAGNOSIS — M25521 Pain in right elbow: Secondary | ICD-10-CM | POA: Diagnosis not present

## 2023-11-06 ENCOUNTER — Other Ambulatory Visit: Payer: Self-pay | Admitting: Obstetrics and Gynecology

## 2023-11-06 DIAGNOSIS — Z1231 Encounter for screening mammogram for malignant neoplasm of breast: Secondary | ICD-10-CM

## 2023-12-05 ENCOUNTER — Ambulatory Visit
Admission: RE | Admit: 2023-12-05 | Discharge: 2023-12-05 | Disposition: A | Discharge status: Home / Self Care | Source: Ambulatory Visit

## 2023-12-05 DIAGNOSIS — Z1231 Encounter for screening mammogram for malignant neoplasm of breast: Secondary | ICD-10-CM | POA: Diagnosis not present

## 2023-12-10 ENCOUNTER — Ambulatory Visit: Payer: Self-pay | Admitting: Obstetrics and Gynecology

## 2024-01-06 ENCOUNTER — Encounter: Payer: Self-pay | Admitting: Obstetrics and Gynecology

## 2024-01-06 ENCOUNTER — Other Ambulatory Visit (HOSPITAL_COMMUNITY)
Admission: RE | Admit: 2024-01-06 | Discharge: 2024-01-06 | Disposition: A | Discharge status: Home / Self Care | Source: Ambulatory Visit | Attending: Obstetrics and Gynecology | Admitting: Obstetrics and Gynecology

## 2024-01-06 ENCOUNTER — Ambulatory Visit: Payer: BC Managed Care – PPO | Admitting: Obstetrics and Gynecology

## 2024-01-06 VITALS — BP 102/62 | Ht 68.5 in | Wt 142.0 lb

## 2024-01-06 DIAGNOSIS — Z124 Encounter for screening for malignant neoplasm of cervix: Secondary | ICD-10-CM | POA: Diagnosis not present

## 2024-01-06 DIAGNOSIS — Z1331 Encounter for screening for depression: Secondary | ICD-10-CM

## 2024-01-06 DIAGNOSIS — Z01419 Encounter for gynecological examination (general) (routine) without abnormal findings: Secondary | ICD-10-CM

## 2024-01-06 MED ORDER — ESTRADIOL 10 MCG VA TABS: 1.0000 | ORAL_TABLET | VAGINAL | 3 refills | Status: AC

## 2024-01-06 MED ORDER — ESTRADIOL 0.05 MG/24HR TD PTTW: 1.0000 | MEDICATED_PATCH | TRANSDERMAL | 3 refills | Status: AC

## 2024-01-06 NOTE — Patient Instructions (Signed)

## 2024-01-06 NOTE — Progress Notes (Signed)
 53 y.o. G2P2 Married Caucasian female here for annual exam.    Doing well on estrogen patch and Vagifem .   Wants to continue.    Had a trip to the beach with her mother and daughter.   PCP: Yolande Toribio MATSU, MD   No LMP recorded (lmp unknown). Patient has had a hysterectomy.           Sexually active: Yes.    The current method of family planning is status post hysterectomy.    Menopausal hormone therapy:  Vagifem  and vivelle   Exercising: Yes.    Weight lifting, cardio, walking 5x week  Smoker:  no  OB History  Gravida Para Term Preterm AB Living  2 2    2   SAB IAB Ectopic Multiple Live Births          # Outcome Date GA Lbr Len/2nd Weight Sex Type Anes PTL Lv  2 Para           1 Para              HEALTH MAINTENANCE: Last 2 paps:  09/15/18 neg HPV neg, 02/06/11 neg  History of abnormal Pap or positive HPV:  no Mammogram:   12/05/23 Breast Density Cat C, BIRADS Cat 1 neg  Colonoscopy:  05/2021 per pt - due in 5 - 10 years.   Bone Density:  n/a  Result  n/a   Immunization History  Administered Date(s) Administered   INFLUENZA, HIGH DOSE SEASONAL PF 01/19/2018, 01/26/2019   Janssen (J&J) SARS-COV-2 Vaccination 10/31/2020      reports that she has never smoked. She has never used smokeless tobacco. She reports that she does not drink alcohol and does not use drugs.  Past Medical History:  Diagnosis Date   COVID-19 virus infection    2020, Jan 2022    Past Surgical History:  Procedure Laterality Date   ABDOMINAL HYSTERECTOMY  2010   BREAST BIOPSY Left 06/04/2012   CESAREAN SECTION  2007 2010   x2   KNEE ARTHROSCOPY Right 1998   SHOULDER ARTHROSCOPY Left 02/20/2022    Current Outpatient Medications  Medication Sig Dispense Refill   Ascorbic Acid (VITAMIN C) 1000 MG tablet Take 1,000 mg by mouth daily.     cetirizine (ZYRTEC) 10 MG tablet Take 10 mg by mouth daily.     Cholecalciferol (VITAMIN D3) 125 MCG (5000 UT) TABS Take 1 tablet by mouth daily.      Estradiol  (VAGIFEM ) 10 MCG TABS vaginal tablet Place 1 tablet (10 mcg total) vaginally 2 (two) times a week. 24 tablet 3   estradiol  (VIVELLE -DOT) 0.05 MG/24HR patch Place 1 patch (0.05 mg total) onto the skin 2 (two) times a week. 24 patch 3   Omega-3 Fatty Acids (FISH OIL) 500 MG CAPS      Turmeric 500 MG CAPS      Zinc Sulfate (ZINC 15 PO) Take 1 capsule by mouth daily.     No current facility-administered medications for this visit.    ALLERGIES: Biaxin [clarithromycin], Keflex [cephalexin], and Sulfamethoxazole-trimethoprim  Family History  Problem Relation Age of Onset   Hypertension Mother    Diabetes Mother    Hyperlipidemia Mother    Diabetes Father    Psoriasis Father    Pulmonary embolism Father     Review of Systems  All other systems reviewed and are negative.   PHYSICAL EXAM:  LMP  (LMP Unknown)     General appearance: alert, cooperative and appears stated age Head: normocephalic,  without obvious abnormality, atraumatic Neck: no adenopathy, supple, symmetrical, trachea midline and thyroid  normal to inspection and palpation Lungs: clear to auscultation bilaterally Breasts: normal appearance, no masses or tenderness, No nipple retraction or dimpling, No nipple discharge or bleeding, No axillary adenopathy Heart: regular rate and rhythm Abdomen: soft, non-tender; no masses, no organomegaly Extremities: extremities normal, atraumatic, no cyanosis or edema Skin: skin color, texture, turgor normal. No rashes or lesions Lymph nodes: cervical, supraclavicular, and axillary nodes normal. Neurologic: grossly normal  Pelvic: External genitalia:  no lesions              No abnormal inguinal nodes palpated.              Urethra:  normal appearing urethra with no masses, tenderness or lesions              Bartholins and Skenes: normal                 Vagina: normal appearing vagina with normal color and discharge, no lesions              Cervix: no lesions               Pap taken: yes Bimanual Exam:  Uterus:  absent              Adnexa: no mass, fullness, tenderness              Rectal exam: yes.  Confirms.              Anus:  normal sphincter tone, no lesions  Chaperone was present for exam:  Joy J, CMa  ASSESSMENT: Well woman visit with gynecologic exam. Status post C-Hyst with supracervical hysterectomy.  Cervical cancer screening.  ERT.  Vaginal atrophy.  PHQ-2-9: 0  PLAN: Mammogram screening discussed. Self breast awareness reviewed. Pap and HRV collected:  yes Guidelines for Calcium, Vitamin D , regular exercise program including cardiovascular and weight bearing exercise. Discused WHI and use of ERT which can increase risk of PE, DVT, stroke. Benefits of treating vasomotor symptoms and reducing risk of osteoporosis also reviewed. Medication refills:  Vivelle  Dot 0.05 mg twice weekly, Vagifem  10 mcg pv twice weekly. Labs with PCP.  Follow up:  yearly and prn.

## 2024-01-07 ENCOUNTER — Ambulatory Visit: Payer: Self-pay | Admitting: Obstetrics and Gynecology

## 2024-01-07 LAB — CYTOLOGY - PAP
Comment: NEGATIVE
Diagnosis: NEGATIVE
High risk HPV: NEGATIVE

## 2024-01-09 DIAGNOSIS — D485 Neoplasm of uncertain behavior of skin: Secondary | ICD-10-CM | POA: Diagnosis not present

## 2024-01-09 DIAGNOSIS — L821 Other seborrheic keratosis: Secondary | ICD-10-CM | POA: Diagnosis not present

## 2024-01-09 DIAGNOSIS — D2262 Melanocytic nevi of left upper limb, including shoulder: Secondary | ICD-10-CM | POA: Diagnosis not present

## 2024-01-09 DIAGNOSIS — D2272 Melanocytic nevi of left lower limb, including hip: Secondary | ICD-10-CM | POA: Diagnosis not present

## 2024-01-09 DIAGNOSIS — D225 Melanocytic nevi of trunk: Secondary | ICD-10-CM | POA: Diagnosis not present

## 2024-01-10 DIAGNOSIS — M25521 Pain in right elbow: Secondary | ICD-10-CM | POA: Diagnosis not present

## 2024-01-13 DIAGNOSIS — Z0000 Encounter for general adult medical examination without abnormal findings: Secondary | ICD-10-CM | POA: Diagnosis not present

## 2024-01-13 DIAGNOSIS — D72819 Decreased white blood cell count, unspecified: Secondary | ICD-10-CM | POA: Diagnosis not present

## 2024-01-13 DIAGNOSIS — R82998 Other abnormal findings in urine: Secondary | ICD-10-CM | POA: Diagnosis not present

## 2024-03-30 ENCOUNTER — Encounter: Payer: Self-pay | Admitting: Obstetrics and Gynecology

## 2024-03-31 NOTE — Telephone Encounter (Signed)
 Per pharmacy they faxed a request regarding the estradiol  patch 0.05mg  3 times and we still have not received it. Per pharmacist. The patch it out of stock and have no idea when any of the pharmacies will be able to get it. You could maybe send it to [facility] to see if they have it. Please advise.  Last aex: 01-06-24 MMG: 12-05-23 category c, neg.

## 2025-01-11 ENCOUNTER — Ambulatory Visit: Admitting: Obstetrics and Gynecology
